# Patient Record
Sex: Female | Born: 1970 | Race: White | Hispanic: No | Marital: Single | Smoking: Never smoker
Health system: Southern US, Community
[De-identification: ages and names within clinical notes are randomized; demographics above are authoritative.]

## PROBLEM LIST (undated history)

## (undated) DIAGNOSIS — E119 Type 2 diabetes mellitus without complications: Secondary | ICD-10-CM

## (undated) DIAGNOSIS — Q059 Spina bifida, unspecified: Secondary | ICD-10-CM

## (undated) DIAGNOSIS — G8220 Paraplegia, unspecified: Secondary | ICD-10-CM

---

## 2010-03-28 ENCOUNTER — Emergency Department (HOSPITAL_BASED_OUTPATIENT_CLINIC_OR_DEPARTMENT_OTHER): Admission: EM | Admit: 2010-03-28 | Discharge: 2010-03-28 | Payer: Self-pay | Admitting: Emergency Medicine

## 2013-06-14 ENCOUNTER — Ambulatory Visit: Payer: Self-pay | Admitting: Cardiology

## 2014-12-21 DIAGNOSIS — Z9359 Other cystostomy status: Secondary | ICD-10-CM

## 2015-10-13 DIAGNOSIS — I10 Essential (primary) hypertension: Secondary | ICD-10-CM | POA: Diagnosis present

## 2015-10-13 DIAGNOSIS — E1165 Type 2 diabetes mellitus with hyperglycemia: Secondary | ICD-10-CM | POA: Diagnosis present

## 2016-12-16 ENCOUNTER — Other Ambulatory Visit: Payer: Self-pay | Admitting: Physician Assistant

## 2016-12-16 DIAGNOSIS — R928 Other abnormal and inconclusive findings on diagnostic imaging of breast: Secondary | ICD-10-CM

## 2016-12-29 ENCOUNTER — Other Ambulatory Visit: Payer: Self-pay | Admitting: Physician Assistant

## 2016-12-29 DIAGNOSIS — R928 Other abnormal and inconclusive findings on diagnostic imaging of breast: Secondary | ICD-10-CM

## 2016-12-29 DIAGNOSIS — N6489 Other specified disorders of breast: Secondary | ICD-10-CM

## 2017-01-03 ENCOUNTER — Other Ambulatory Visit: Payer: Self-pay | Admitting: Physician Assistant

## 2017-01-03 ENCOUNTER — Ambulatory Visit
Admission: RE | Admit: 2017-01-03 | Discharge: 2017-01-03 | Disposition: A | Discharge status: Home / Self Care | Payer: Medicare Other | Source: Ambulatory Visit | Attending: Physician Assistant | Admitting: Physician Assistant

## 2017-01-03 DIAGNOSIS — R599 Enlarged lymph nodes, unspecified: Secondary | ICD-10-CM

## 2017-01-03 DIAGNOSIS — N6489 Other specified disorders of breast: Secondary | ICD-10-CM

## 2017-01-03 DIAGNOSIS — R928 Other abnormal and inconclusive findings on diagnostic imaging of breast: Secondary | ICD-10-CM

## 2018-07-03 IMAGING — MG STEREOTACTIC VACUUM ASSIST RIGHT
3 series · 3 of 11 positions shown · non-contrast
Comparison: Previous exams.

ADDENDUM:
Pathology revealed DUCTAL PAPILLOMA WITH USUAL DUCTAL HYPERPLASIA,
ADENOSIS WITH CALCIFICATIONS of the Right breast, central inferior.
This was found to be discordant by Dr. Dubiso Terafa, with excision
recommended. Pathology revealed BENIGN LYMPHOID TISSUE of the Right
axilla. This was found to be concordant by Dr. Dubiso Terafa.
Pathology results were discussed with the patient by telephone. The
patient reported doing well after the biopsies with tenderness at
the sites. Post biopsy instructions and care were reviewed and
questions were answered. The patient was encouraged to call The
Surgical consultation has been arranged with Dr. Uly Siciliano at
[[email or web address]] in [HOSPITAL][HOSPITAL], per patient request on
January 07, 2017. Imaging and pathology reports were faxed to Dr.
Lerie on January 04, 2017.

Pathology results reported by Swanson, RN on 01/04/2017.
CLINICAL DATA: Right breast distortion
EXAM:
RIGHT BREAST STEREOTACTIC CORE NEEDLE BIOPSY

[R CC]
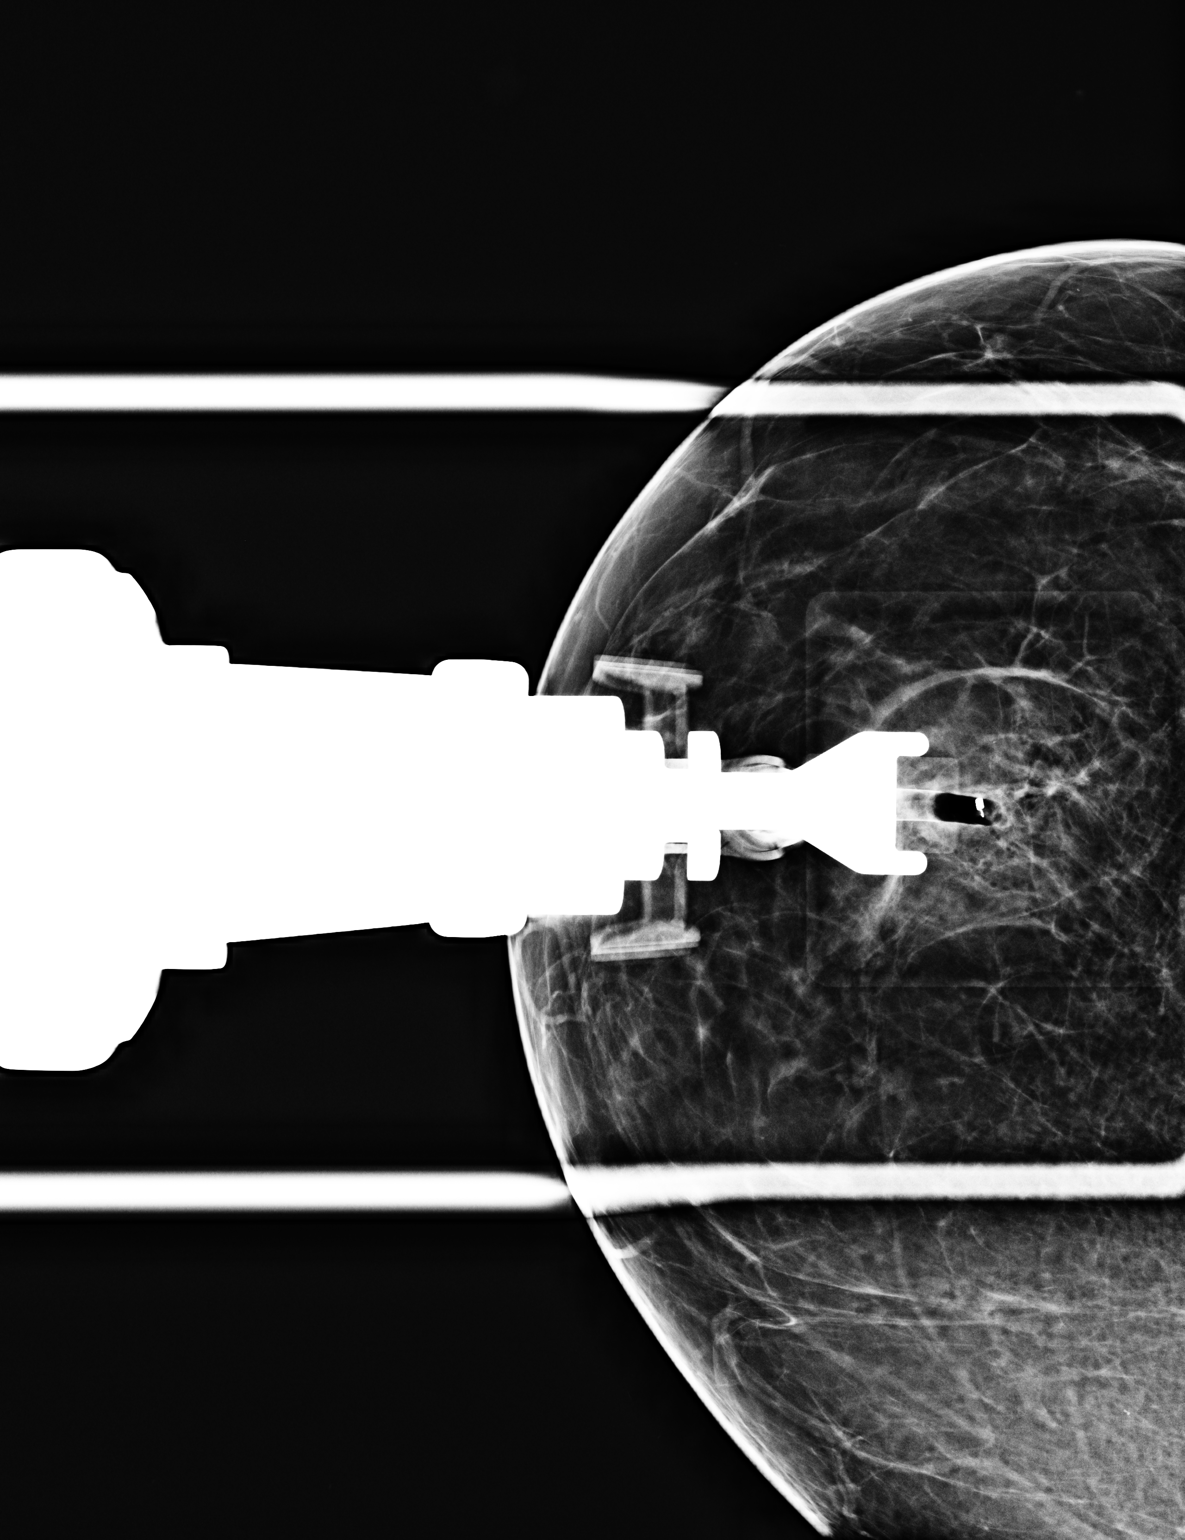

[R CC tomo (1 of 2) · tomo slice 23/46.0]
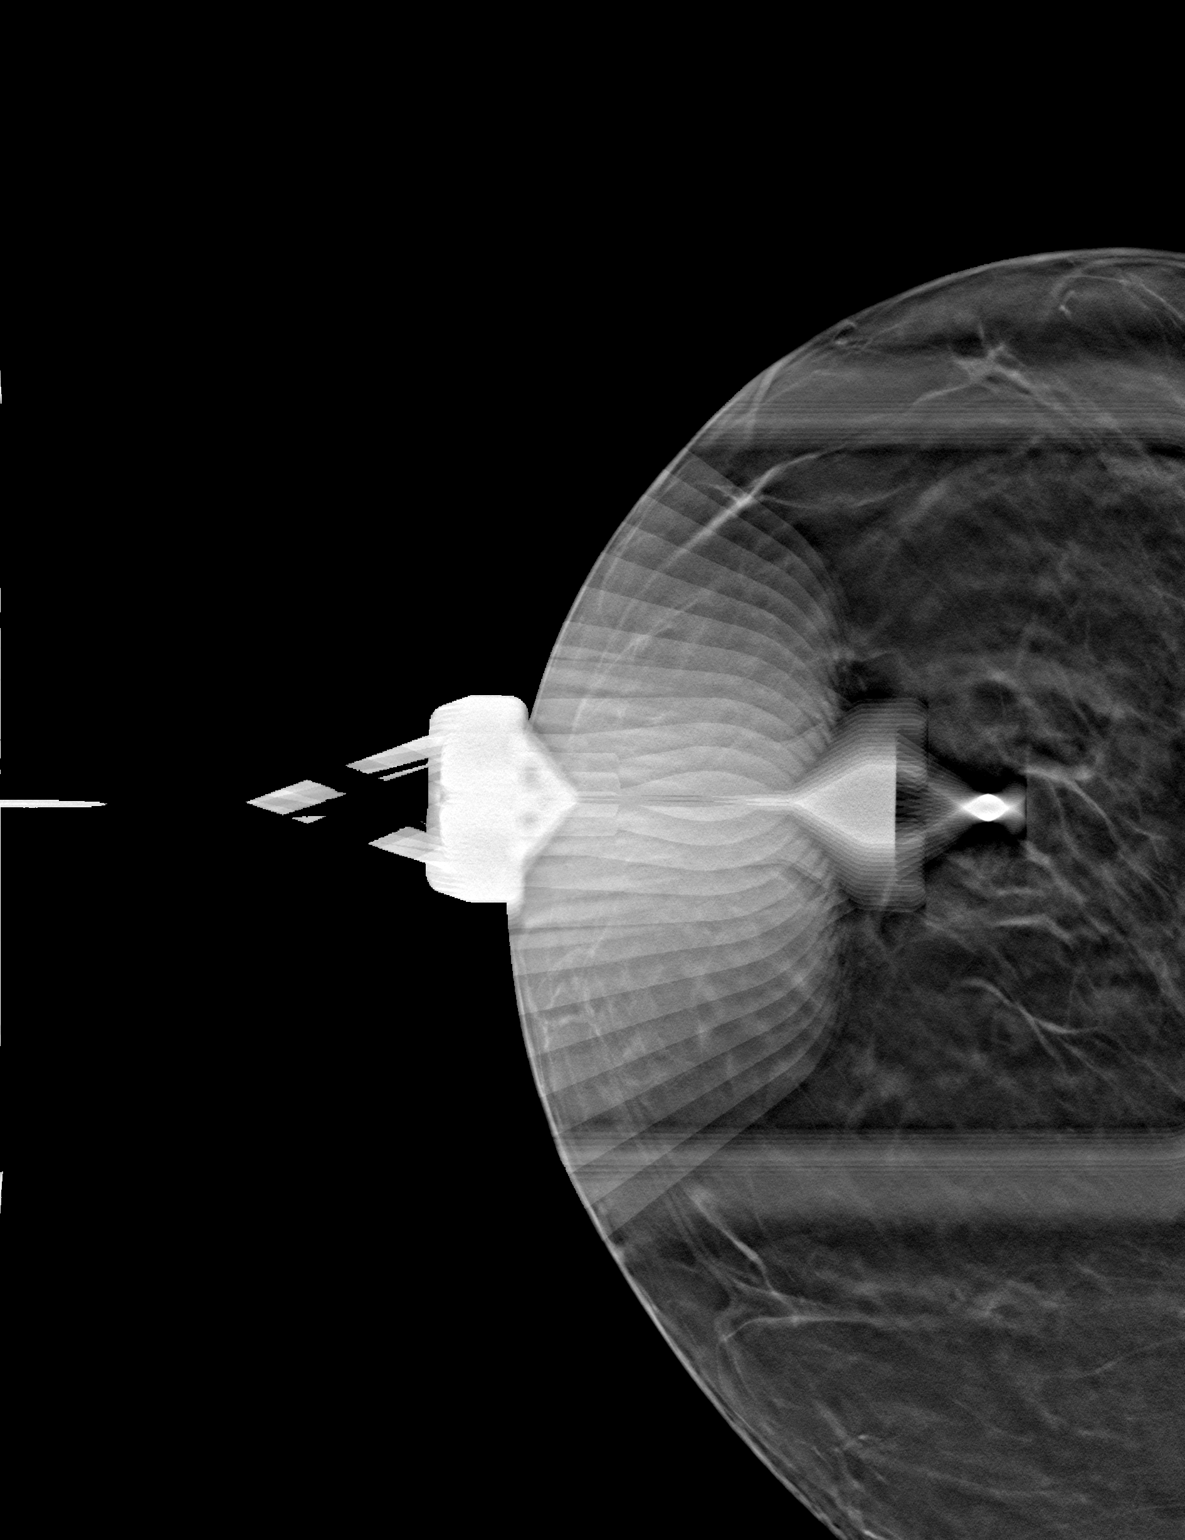

[R CC tomo (2 of 2) · tomo slice 23/46.0]
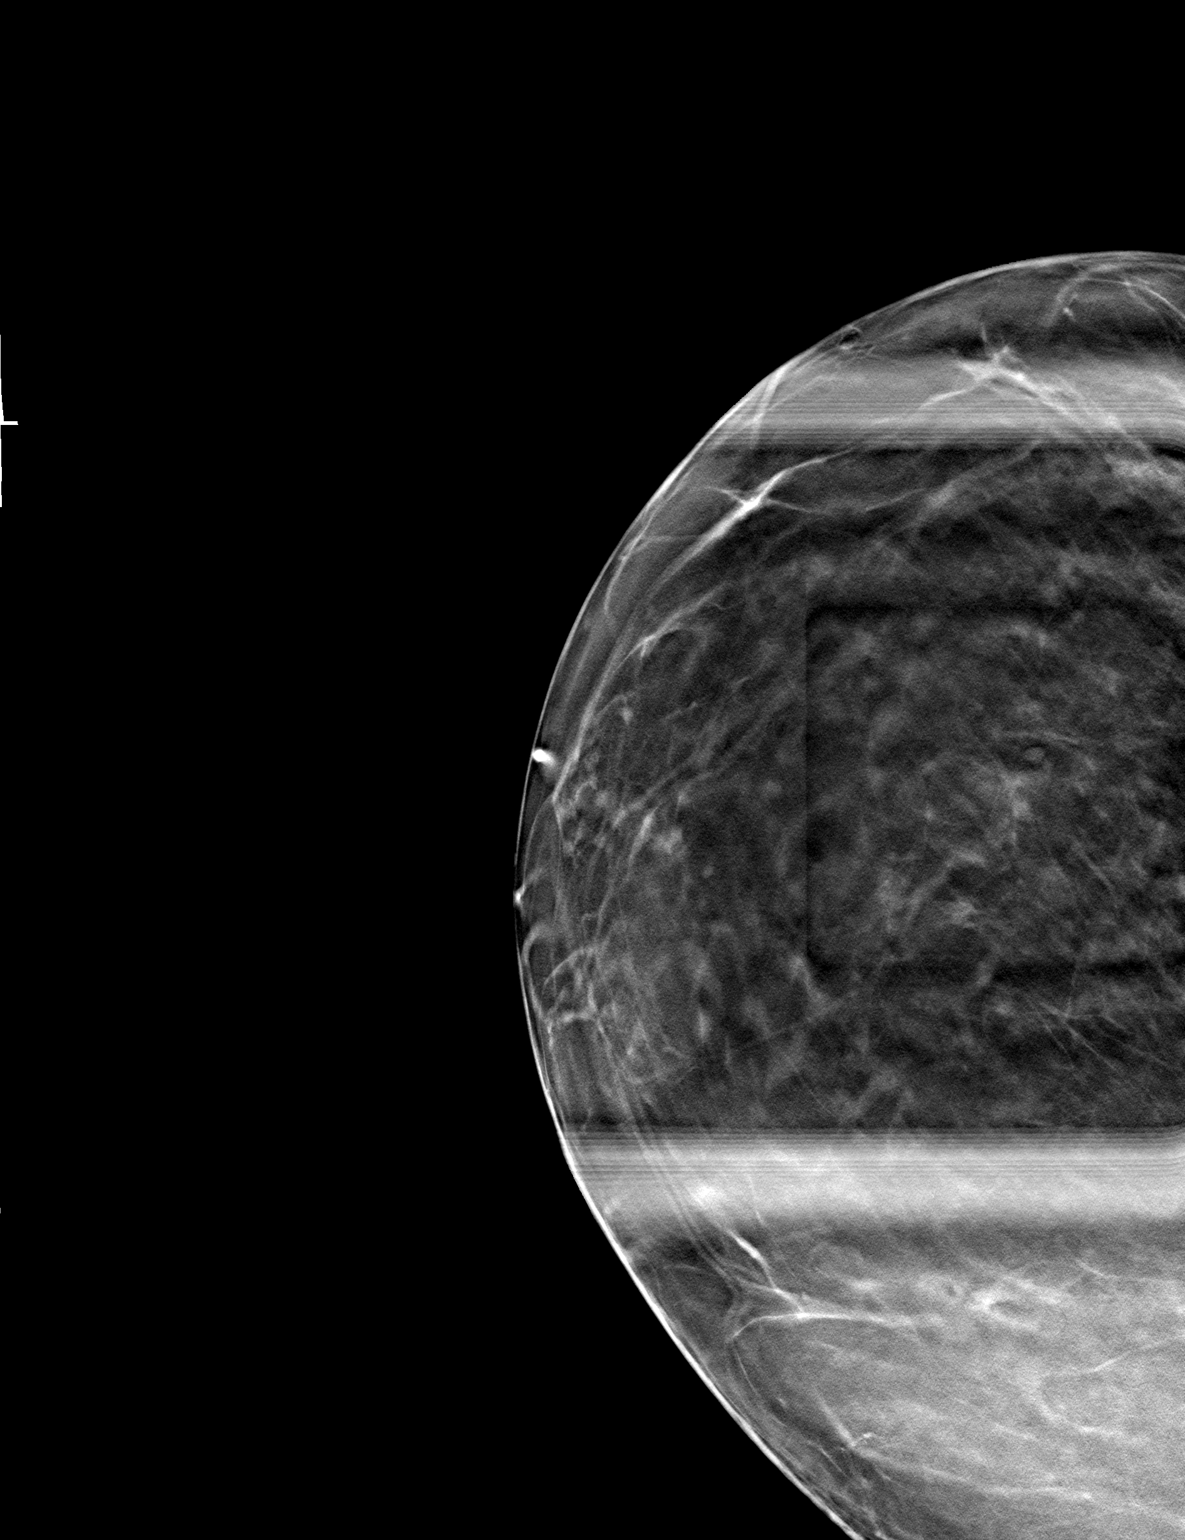

[3 of 11 positions shown; findings below may reference images not displayed]



Using sterile technique and 1% Lidocaine as local anesthetic, under
stereotactic guidance, a 9 gauge vacuum assisted device was used to
perform core needle biopsy of distortion in the inferior central
right breast, only seen on the CC view using a superior approach.

Lesion quadrant: Lower inner

At the conclusion of the procedure, a tissue marker clip was
deployed into the biopsy cavity. Follow-up 2-view mammogram was
performed and dictated separately.
IMPRESSION: Stereotactic-guided biopsy of right breast distortion. No apparent
complications.

## 2018-07-03 IMAGING — MG MM CLIP PLACEMENT
3 series · 3 of 7 positions shown · non-contrast
Comparison: Previous exam(s).

CLINICAL DATA: Evaluate marker placement

EXAM:
DIAGNOSTIC RIGHT MAMMOGRAM POST STEREOTACTIC BIOPSY

[R CC synth-2D]
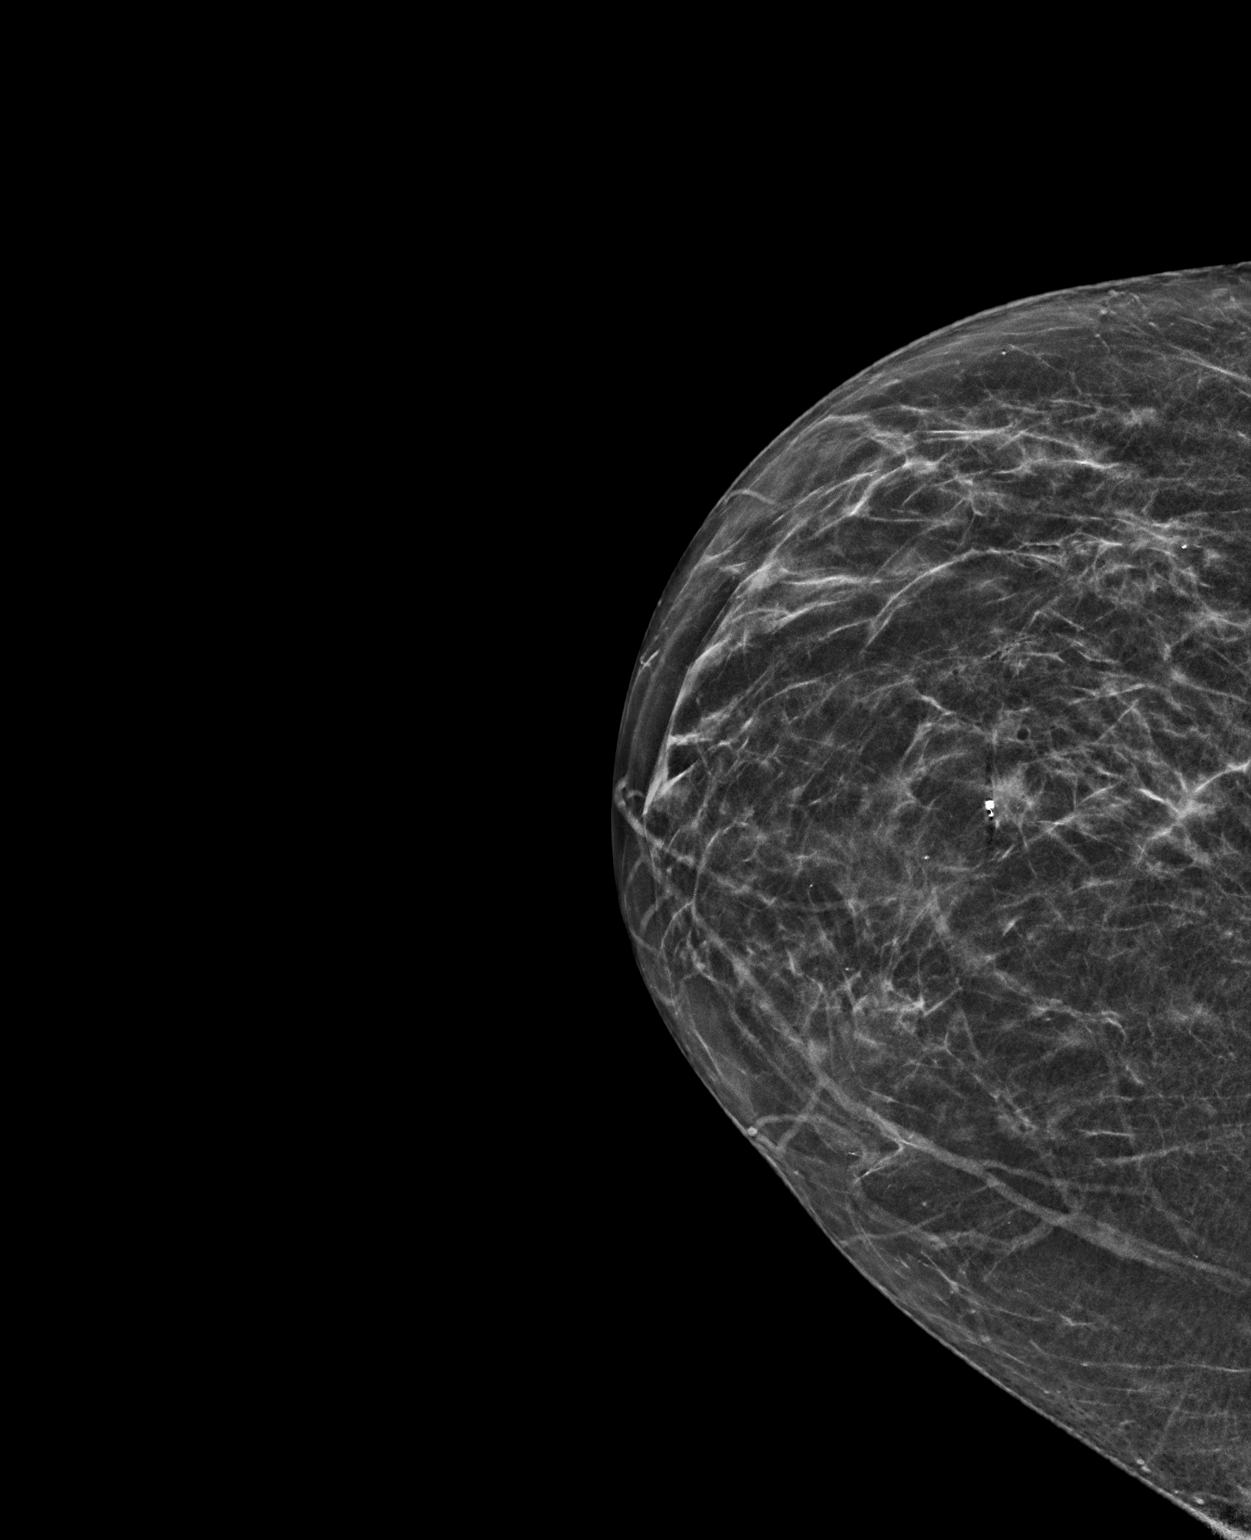

[R CC]
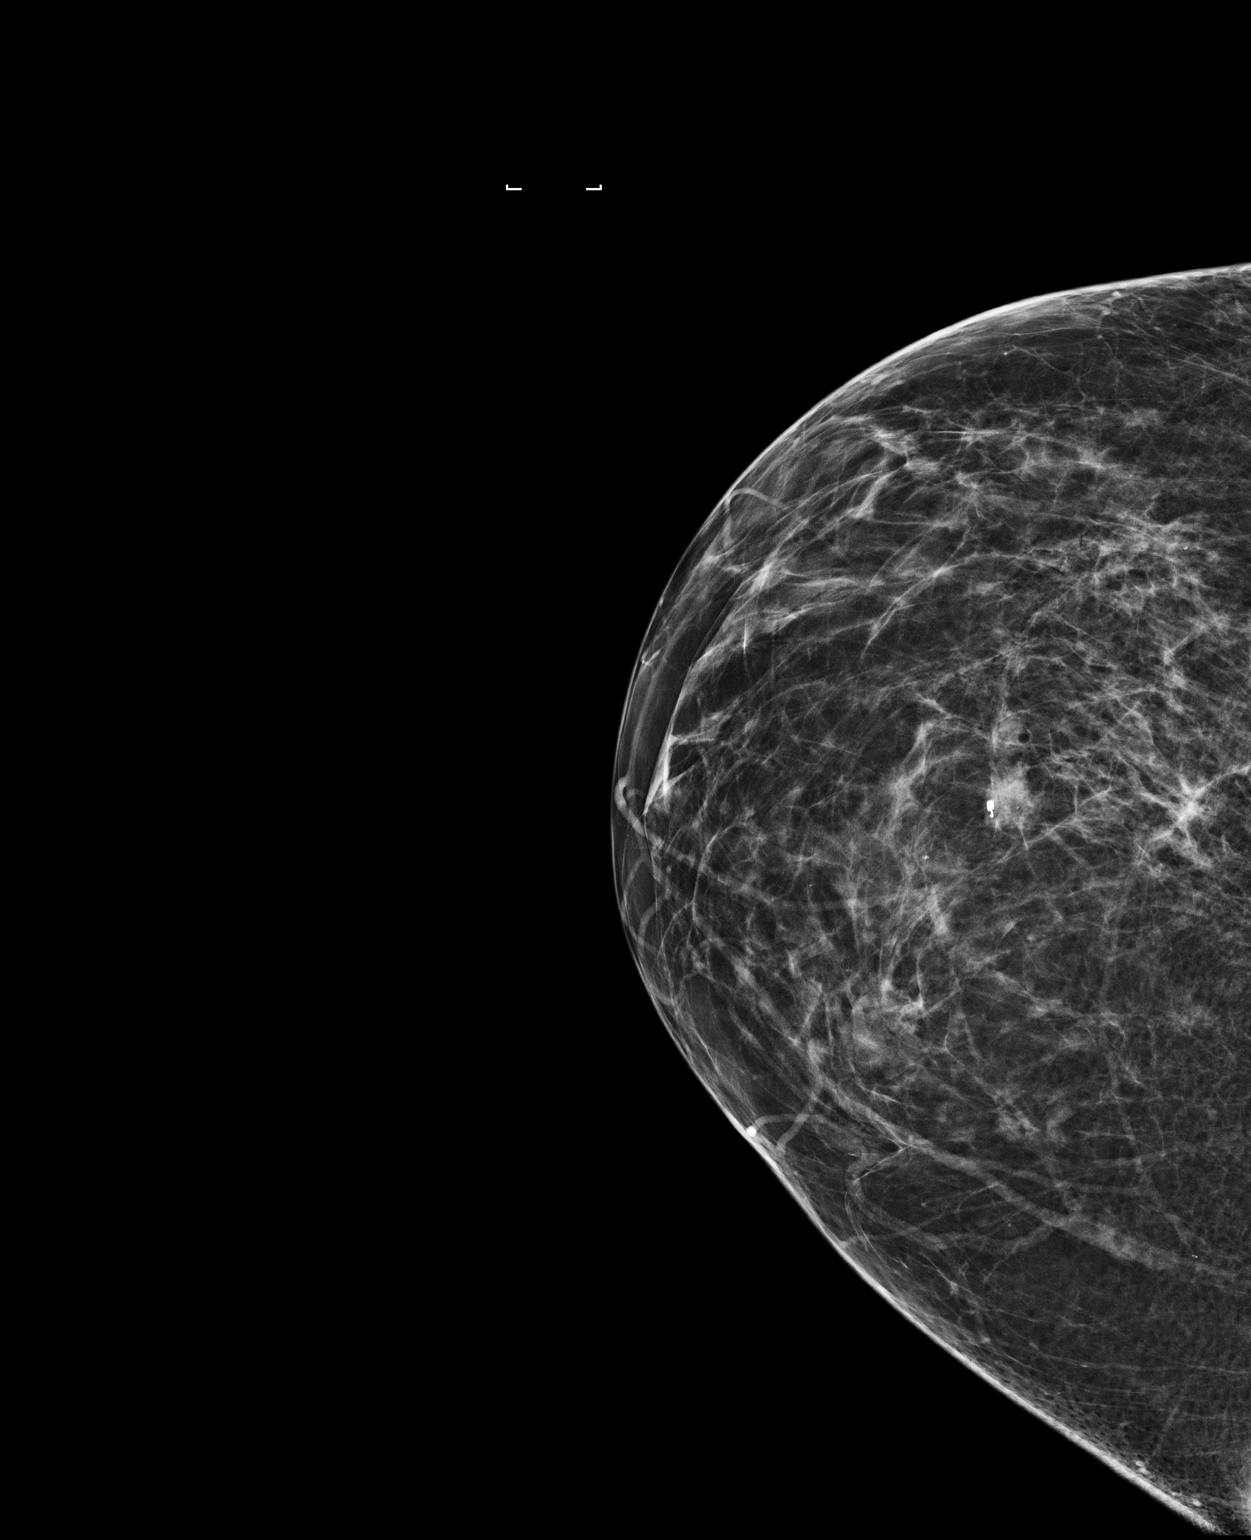

[R CC tomo · tomo slice 27/53.0]
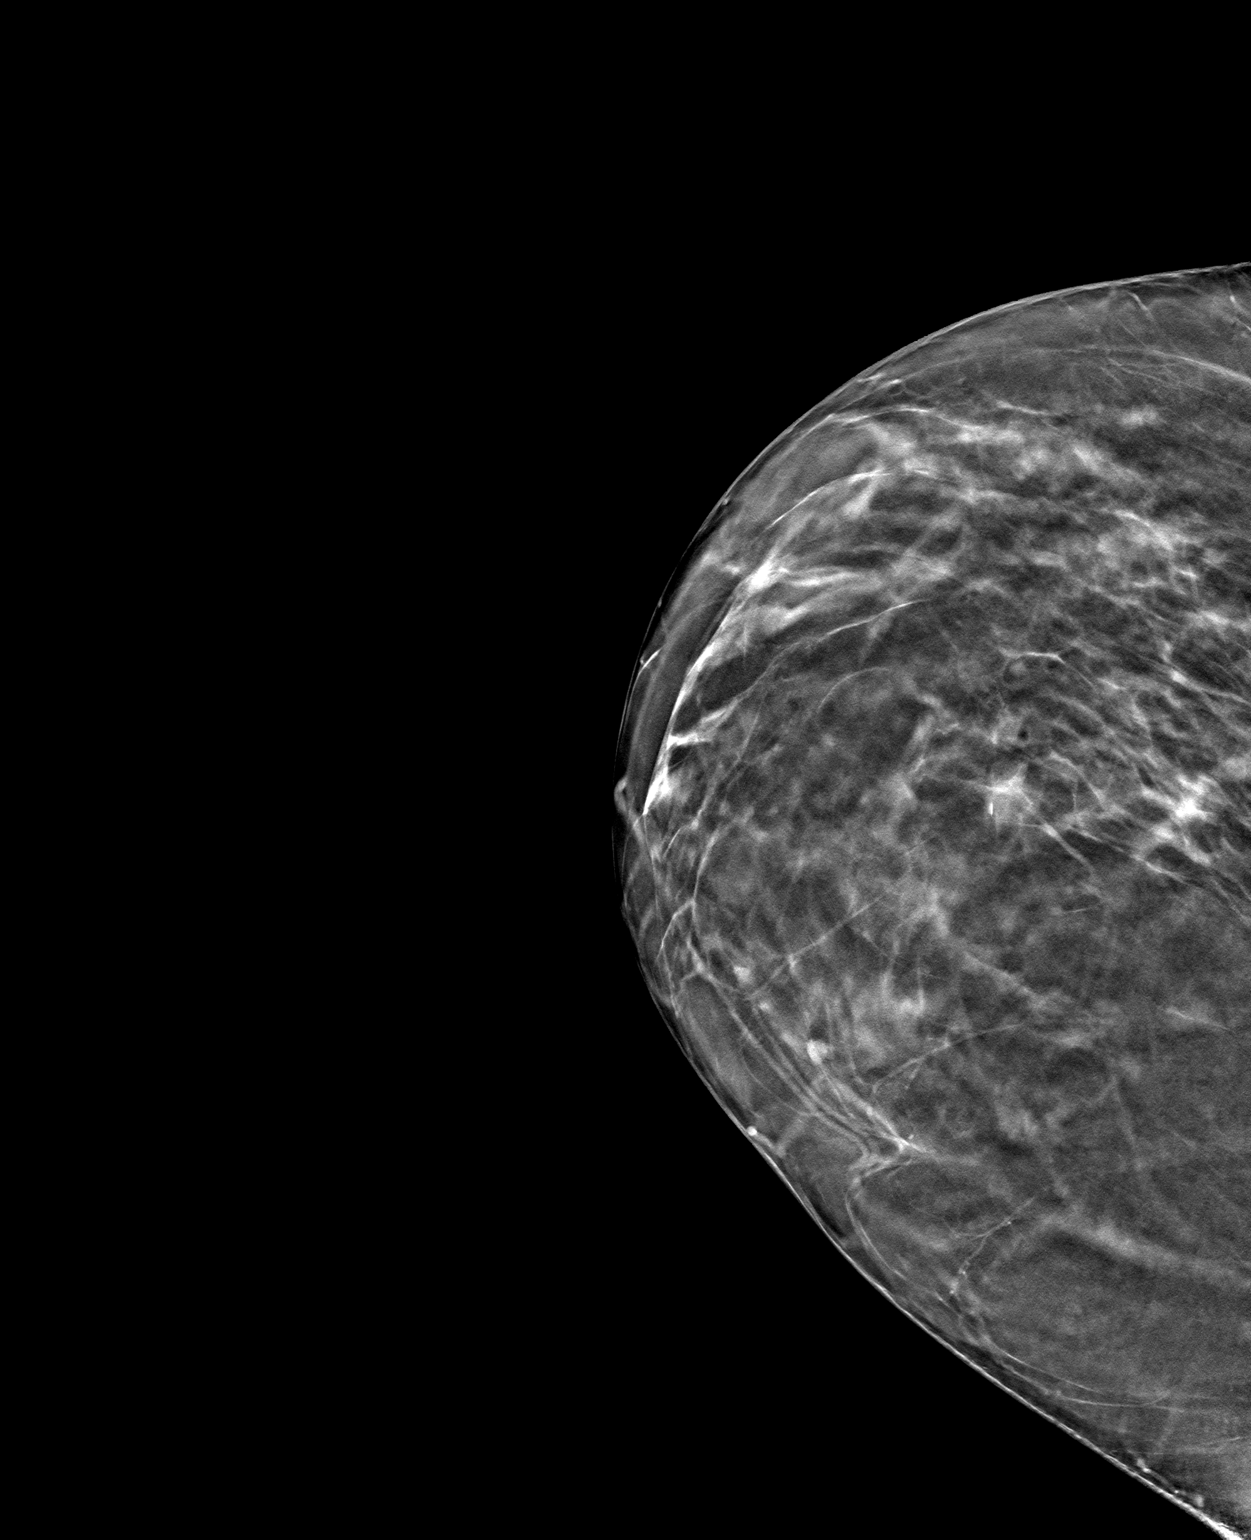

[3 of 7 positions shown; findings below may reference images not displayed]

FINDINGS: Mammographic images were obtained following stereotactic guided
biopsy of right breast distortion. The clip appears to be along the
anterior margin of the distortion on 3D images.
IMPRESSION: Appropriate clip placement based on 3D images.

Final Assessment: Post Procedure Mammograms for Marker Placement

## 2022-02-12 ENCOUNTER — Emergency Department (HOSPITAL_BASED_OUTPATIENT_CLINIC_OR_DEPARTMENT_OTHER): Payer: Medicare (Managed Care)

## 2022-02-12 ENCOUNTER — Inpatient Hospital Stay (HOSPITAL_BASED_OUTPATIENT_CLINIC_OR_DEPARTMENT_OTHER)
Admission: EM | Admit: 2022-02-12 | Discharge: 2022-02-17 | DRG: 602 | Disposition: A | Discharge status: Home / Self Care | Payer: Medicare (Managed Care) | Attending: Family Medicine | Admitting: Family Medicine

## 2022-02-12 ENCOUNTER — Encounter (HOSPITAL_BASED_OUTPATIENT_CLINIC_OR_DEPARTMENT_OTHER): Payer: Self-pay

## 2022-02-12 ENCOUNTER — Other Ambulatory Visit: Payer: Self-pay

## 2022-02-12 DIAGNOSIS — I10 Essential (primary) hypertension: Secondary | ICD-10-CM | POA: Diagnosis present

## 2022-02-12 DIAGNOSIS — R7881 Bacteremia: Secondary | ICD-10-CM | POA: Diagnosis present

## 2022-02-12 DIAGNOSIS — L89324 Pressure ulcer of left buttock, stage 4: Secondary | ICD-10-CM | POA: Diagnosis present

## 2022-02-12 DIAGNOSIS — E876 Hypokalemia: Secondary | ICD-10-CM | POA: Diagnosis not present

## 2022-02-12 DIAGNOSIS — Z79899 Other long term (current) drug therapy: Secondary | ICD-10-CM

## 2022-02-12 DIAGNOSIS — Q052 Lumbar spina bifida with hydrocephalus: Secondary | ICD-10-CM

## 2022-02-12 DIAGNOSIS — L89892 Pressure ulcer of other site, stage 2: Secondary | ICD-10-CM | POA: Diagnosis present

## 2022-02-12 DIAGNOSIS — A4102 Sepsis due to Methicillin resistant Staphylococcus aureus: Principal | ICD-10-CM | POA: Diagnosis present

## 2022-02-12 DIAGNOSIS — D649 Anemia, unspecified: Secondary | ICD-10-CM | POA: Diagnosis present

## 2022-02-12 DIAGNOSIS — L89154 Pressure ulcer of sacral region, stage 4: Secondary | ICD-10-CM | POA: Diagnosis present

## 2022-02-12 DIAGNOSIS — A4902 Methicillin resistant Staphylococcus aureus infection, unspecified site: Secondary | ICD-10-CM | POA: Diagnosis present

## 2022-02-12 DIAGNOSIS — A419 Sepsis, unspecified organism: Secondary | ICD-10-CM

## 2022-02-12 DIAGNOSIS — Z888 Allergy status to other drugs, medicaments and biological substances status: Secondary | ICD-10-CM

## 2022-02-12 DIAGNOSIS — L03116 Cellulitis of left lower limb: Principal | ICD-10-CM

## 2022-02-12 DIAGNOSIS — N319 Neuromuscular dysfunction of bladder, unspecified: Secondary | ICD-10-CM | POA: Diagnosis present

## 2022-02-12 DIAGNOSIS — Z933 Colostomy status: Secondary | ICD-10-CM

## 2022-02-12 DIAGNOSIS — R310 Gross hematuria: Secondary | ICD-10-CM | POA: Diagnosis present

## 2022-02-12 DIAGNOSIS — Z7984 Long term (current) use of oral hypoglycemic drugs: Secondary | ICD-10-CM

## 2022-02-12 DIAGNOSIS — B9562 Methicillin resistant Staphylococcus aureus infection as the cause of diseases classified elsewhere: Secondary | ICD-10-CM

## 2022-02-12 DIAGNOSIS — L539 Erythematous condition, unspecified: Secondary | ICD-10-CM | POA: Diagnosis present

## 2022-02-12 DIAGNOSIS — G8220 Paraplegia, unspecified: Secondary | ICD-10-CM | POA: Diagnosis present

## 2022-02-12 DIAGNOSIS — L89159 Pressure ulcer of sacral region, unspecified stage: Secondary | ICD-10-CM

## 2022-02-12 DIAGNOSIS — Q6589 Other specified congenital deformities of hip: Secondary | ICD-10-CM

## 2022-02-12 DIAGNOSIS — Z91048 Other nonmedicinal substance allergy status: Secondary | ICD-10-CM

## 2022-02-12 DIAGNOSIS — N390 Urinary tract infection, site not specified: Secondary | ICD-10-CM | POA: Diagnosis present

## 2022-02-12 DIAGNOSIS — Z91040 Latex allergy status: Secondary | ICD-10-CM

## 2022-02-12 DIAGNOSIS — L89314 Pressure ulcer of right buttock, stage 4: Secondary | ICD-10-CM | POA: Diagnosis present

## 2022-02-12 DIAGNOSIS — Z7982 Long term (current) use of aspirin: Secondary | ICD-10-CM

## 2022-02-12 DIAGNOSIS — M7989 Other specified soft tissue disorders: Secondary | ICD-10-CM | POA: Diagnosis present

## 2022-02-12 DIAGNOSIS — E1165 Type 2 diabetes mellitus with hyperglycemia: Secondary | ICD-10-CM | POA: Diagnosis present

## 2022-02-12 DIAGNOSIS — Z9359 Other cystostomy status: Secondary | ICD-10-CM

## 2022-02-12 DIAGNOSIS — Z6841 Body Mass Index (BMI) 40.0 and over, adult: Secondary | ICD-10-CM

## 2022-02-12 DIAGNOSIS — Q054 Unspecified spina bifida with hydrocephalus: Secondary | ICD-10-CM

## 2022-02-12 DIAGNOSIS — E1169 Type 2 diabetes mellitus with other specified complication: Secondary | ICD-10-CM | POA: Diagnosis present

## 2022-02-12 DIAGNOSIS — Z20822 Contact with and (suspected) exposure to covid-19: Secondary | ICD-10-CM | POA: Diagnosis present

## 2022-02-12 DIAGNOSIS — Z881 Allergy status to other antibiotic agents status: Secondary | ICD-10-CM

## 2022-02-12 HISTORY — DX: Spina bifida, unspecified: Q05.9

## 2022-02-12 HISTORY — DX: Type 2 diabetes mellitus without complications: E11.9

## 2022-02-12 HISTORY — DX: Paraplegia, unspecified: G82.20

## 2022-02-12 LAB — COMPREHENSIVE METABOLIC PANEL
ALT: 20 U/L (ref 0–44)
AST: 19 U/L (ref 15–41)
Albumin: 3.1 g/dL — ABNORMAL LOW (ref 3.5–5.0)
Alkaline Phosphatase: 129 U/L — ABNORMAL HIGH (ref 38–126)
Anion gap: 11 (ref 5–15)
BUN: 14 mg/dL (ref 6–20)
CO2: 22 mmol/L (ref 22–32)
Calcium: 9.2 mg/dL (ref 8.9–10.3)
Chloride: 101 mmol/L (ref 98–111)
Creatinine, Ser: 0.43 mg/dL — ABNORMAL LOW (ref 0.44–1.00)
GFR, Estimated: >60 mL/min (ref >60)
Glucose, Bld: 189 mg/dL — ABNORMAL HIGH (ref 70–99)
Potassium: 3.8 mmol/L (ref 3.5–5.1)
Sodium: 134 mmol/L — ABNORMAL LOW (ref 135–145)
Total Bilirubin: 0.4 mg/dL (ref 0.3–1.2)
Total Protein: 8.2 g/dL — ABNORMAL HIGH (ref 6.5–8.1)

## 2022-02-12 LAB — PROTIME-INR
INR: 1.2 (ref 0.8–1.2)
Prothrombin Time: 15.4 seconds — ABNORMAL HIGH (ref 11.4–15.2)

## 2022-02-12 LAB — CBC WITH DIFFERENTIAL/PLATELET
Abs Immature Granulocytes: 0.1 10*3/uL — ABNORMAL HIGH (ref 0.00–0.07)
Basophils Absolute: 0.1 10*3/uL (ref 0.0–0.1)
Basophils Relative: 0 %
Eosinophils Absolute: 0.2 10*3/uL (ref 0.0–0.5)
Eosinophils Relative: 2 %
HCT: 33.2 % — ABNORMAL LOW (ref 36.0–46.0)
Hemoglobin: 10.6 g/dL — ABNORMAL LOW (ref 12.0–15.0)
Immature Granulocytes: 1 %
Lymphocytes Relative: 9 %
Lymphs Abs: 1.4 10*3/uL (ref 0.7–4.0)
MCH: 25.1 pg — ABNORMAL LOW (ref 26.0–34.0)
MCHC: 31.9 g/dL (ref 30.0–36.0)
MCV: 78.7 fL — ABNORMAL LOW (ref 80.0–100.0)
Monocytes Absolute: 1 10*3/uL (ref 0.1–1.0)
Monocytes Relative: 6 %
Neutro Abs: 12.4 10*3/uL — ABNORMAL HIGH (ref 1.7–7.7)
Neutrophils Relative %: 82 %
Platelets: 433 10*3/uL — ABNORMAL HIGH (ref 150–400)
RBC: 4.22 MIL/uL (ref 3.87–5.11)
RDW: 18.6 % — ABNORMAL HIGH (ref 11.5–15.5)
WBC: 15.1 10*3/uL — ABNORMAL HIGH (ref 4.0–10.5)
nRBC: 0 % (ref 0.0–0.2)

## 2022-02-12 LAB — URINALYSIS, MICROSCOPIC (REFLEX)

## 2022-02-12 LAB — URINALYSIS, ROUTINE W REFLEX MICROSCOPIC
Bilirubin Urine: NEGATIVE
Glucose, UA: NEGATIVE mg/dL
Ketones, ur: NEGATIVE mg/dL
Nitrite: POSITIVE — AB
Protein, ur: 100 mg/dL — AB
Specific Gravity, Urine: <=1.005 (ref 1.005–1.030)
pH: 6 (ref 5.0–8.0)

## 2022-02-12 LAB — RESP PANEL BY RT-PCR (FLU A&B, COVID) ARPGX2
Influenza A by PCR: NEGATIVE
Influenza B by PCR: NEGATIVE
SARS Coronavirus 2 by RT PCR: NEGATIVE

## 2022-02-12 LAB — APTT: aPTT: 38 seconds — ABNORMAL HIGH (ref 24–36)

## 2022-02-12 LAB — PREGNANCY, URINE: Preg Test, Ur: NEGATIVE

## 2022-02-12 LAB — LACTIC ACID, PLASMA: Lactic Acid, Venous: 1.5 mmol/L (ref 0.5–1.9)

## 2022-02-12 MED ORDER — LACTATED RINGERS IV SOLN: INTRAVENOUS | Status: DC

## 2022-02-12 MED ORDER — SODIUM CHLORIDE 0.9 % IV BOLUS
1000.0000 mL | Freq: Once | INTRAVENOUS | Status: AC
  Administered 2022-02-12: 1000 mL via INTRAVENOUS

## 2022-02-12 MED ORDER — SODIUM CHLORIDE 0.9 % IV SOLN
2.0000 g | INTRAVENOUS | Status: DC
  Administered 2022-02-12: 2 g via INTRAVENOUS
  Filled 2022-02-12: qty 20

## 2022-02-12 MED ORDER — ACETAMINOPHEN 325 MG PO TABS
650.0000 mg | ORAL_TABLET | Freq: Four times a day (QID) | ORAL | Status: DC | PRN
  Administered 2022-02-12 – 2022-02-17 (×3): 650 mg via ORAL
  Filled 2022-02-12 (×3): qty 2

## 2022-02-12 MED ORDER — VANCOMYCIN HCL IN DEXTROSE 1-5 GM/200ML-% IV SOLN
1000.0000 mg | Freq: Once | INTRAVENOUS | Status: AC
  Administered 2022-02-12: 1000 mg via INTRAVENOUS
  Filled 2022-02-12: qty 200

## 2022-02-12 NOTE — ED Notes (Signed)
US at bedside

## 2022-02-12 NOTE — ED Provider Notes (Signed)
[facility] EMERGENCY DEPARTMENT Provider Note   CSN: [id number] Arrival date & time: 02/12/22  1650     History  Chief Complaint  Patient presents with   Hematuria        Leg Swelling    April Cabrera is a 51 y.o. female.  The history is provided by the patient, a relative and medical records. No language interpreter was used.  Hematuria     April Cabrera is a 51 yo Female with history of HTN, T2DM, Spina Bifida, neurogenic bladder with foley catheter and paraplegia presents to the ED today with Hematuria x 1day, Leg swelling and redness x 1 day, and tachycardia. Patient unable to feel anything below the waist, but can tell there is more pressure than usual. She denies any abdominal pain, fever, chills, recent weight loss. She is on aspirin. Family at bedside reports left leg swelling and redness since yesterday. Patient unable to feel pain in that leg but reports that her skin is constantly dry and flaky in that area. Her family reports that at baseline, she has a heart rate in the 100s, but not typically as high as seen in the office today at 120s. She denies chest pain, increased pressure, shortness of breath, difficulty breathing, back pain, or upper abdominal pain.  Home Medications Prior to Admission medications   Not on File      Allergies    Ciprofloxacin, Macrobid [nitrofurantoin], Wound dressing adhesive, and Latex    Review of Systems   Review of Systems  Genitourinary:  Positive for hematuria.  All other systems reviewed and are negative.   Physical Exam Updated Vital Signs BP 137/88 (BP Location: Left Arm)   Pulse (!) 124   Temp 100 F (37.8 C) (Oral)   Resp 20   SpO2 96%  Physical Exam Vitals and nursing note reviewed.  Constitutional:      General: She is not in acute distress.    Appearance: She is well-developed.     Comments: Patient resting in bed appears to be in no acute discomfort  HENT:     Head: Atraumatic.  Eyes:      Conjunctiva/sclera: Conjunctivae normal.  Cardiovascular:     Rate and Rhythm: Tachycardia present.  Pulmonary:     Effort: Pulmonary effort is normal.  Abdominal:     Palpations: Abdomen is soft.     Tenderness: There is abdominal tenderness (Mild suprapubic tenderness.  Foley catheter in place.  Ostomy bag noted to left abdomen).  Genitourinary:    Comments: Large sacral ulcer Musculoskeletal:        General: Swelling (Left lower extremity: Erythema edema and warmth noted throughout the left lower extremity from the foot extending towards the knee mildly tender to palpation. pedis pulse palpable.) present.     Cervical back: Neck supple.  Skin:    Findings: No rash.  Neurological:     Mental Status: She is alert. Mental status is at baseline.  Psychiatric:        Mood and Affect: Mood normal.     ED Results / Procedures / Treatments   Labs (all labs ordered are listed, but only abnormal results are displayed) Labs Reviewed  CBC WITH DIFFERENTIAL/PLATELET - Abnormal; Notable for the following components:      Result Value   WBC 15.1 (*)    Hemoglobin 10.6 (*)    HCT 33.2 (*)    MCV 78.7 (*)    MCH 25.1 (*)    RDW 18.6 (*)  Platelets 433 (*)    Neutro Abs 12.4 (*)    Abs Immature Granulocytes 0.10 (*)    All other components within normal limits  COMPREHENSIVE METABOLIC PANEL - Abnormal; Notable for the following components:   Sodium 134 (*)    Glucose, Bld 189 (*)    Creatinine, Ser 0.43 (*)    Total Protein 8.2 (*)    Albumin 3.1 (*)    Alkaline Phosphatase 129 (*)    All other components within normal limits  URINALYSIS, ROUTINE W REFLEX MICROSCOPIC - Abnormal; Notable for the following components:   APPearance CLOUDY (*)    Hgb urine dipstick LARGE (*)    Protein, ur 100 (*)    Nitrite POSITIVE (*)    Leukocytes,Ua LARGE (*)    All other components within normal limits  PROTIME-INR - Abnormal; Notable for the following components:   Prothrombin Time 15.4  (*)    All other components within normal limits  APTT - Abnormal; Notable for the following components:   aPTT 38 (*)    All other components within normal limits  URINALYSIS, MICROSCOPIC (REFLEX) - Abnormal; Notable for the following components:   Bacteria, UA MANY (*)    All other components within normal limits  RESP PANEL BY RT-PCR (FLU A&B, COVID) ARPGX2  CULTURE, BLOOD (ROUTINE X 2)  CULTURE, BLOOD (ROUTINE X 2)  URINE CULTURE  PREGNANCY, URINE  LACTIC ACID, PLASMA  LACTIC ACID, PLASMA    EKG EKG Interpretation  Date/Time:  Friday February 12 2022 17:59:34 EDT Ventricular Rate:  122 PR Interval:  131 QRS Duration: 90 QT Interval:  355 QTC Calculation: 506 R Axis:   87 Text Interpretation: Sinus tachycardia Confirmed by Lennice Sites ([id number]) on 02/12/2022 8:08:32 PM  Radiology US Venous Img Lower  Left (DVT Study)  Result Date: 02/12/2022 CLINICAL DATA:  Left leg edema. EXAM: LEFT LOWER EXTREMITY VENOUS DOPPLER ULTRASOUND TECHNIQUE: Gray-scale sonography with compression, as well as color and duplex ultrasound, were performed to evaluate the deep venous system(s) from the level of the common femoral vein through the popliteal and proximal calf veins. COMPARISON:  None Available. FINDINGS: VENOUS Normal compressibility of the common femoral, superficial femoral, and popliteal veins, as well as the visualized calf veins. Visualized portions of profunda femoral vein and great saphenous vein unremarkable. No filling defects to suggest DVT on grayscale or color Doppler imaging. Doppler waveforms show normal direction of venous flow, normal respiratory plasticity and response to augmentation. Limited views of the contralateral common femoral vein are unremarkable. OTHER Subcutaneous soft tissue edema is noted. Limitations: Diminished patient mobility. IMPRESSION: 1. No evidence of left lower extremity DVT. 2. Left leg soft tissue edema. Electronically Signed   By: Keith Rake M.D.    On: 02/12/2022 19:46   DG Chest Port 1 View  Result Date: 02/12/2022 CLINICAL DATA:  Hematuria, low-grade fever, sepsis EXAM: PORTABLE CHEST 1 VIEW COMPARISON:  07/06/2017 FINDINGS: Single frontal view of the chest demonstrates an unremarkable cardiac silhouette. Shunt catheters are unchanged. Chronic elevation the right hemidiaphragm. No acute airspace disease, effusion, or pneumothorax. No acute bony abnormality. IMPRESSION: 1. Stable chest, no acute process. Electronically Signed   By: Randa Ngo M.D.   On: 02/12/2022 17:57    Procedures .Critical Care  Performed by: Domenic Moras, PA-C Authorized by: Domenic Moras, PA-C   Critical care provider statement:    Critical care time (minutes):  30   Critical care was time spent personally by me on the following activities:  Development of treatment plan with patient or surrogate, discussions with consultants, evaluation of patient's response to treatment, examination of patient, ordering and review of laboratory studies, ordering and review of radiographic studies, ordering and performing treatments and interventions, pulse oximetry, re-evaluation of patient's condition and review of old charts     Medications Ordered in ED Medications  lactated ringers infusion ( Intravenous New Bag/Given 02/12/22 1756)  cefTRIAXone (ROCEPHIN) 2 g in sodium chloride 0.9 % 100 mL IVPB (0 g Intravenous Stopped 02/12/22 1848)  acetaminophen (TYLENOL) tablet 650 mg (650 mg Oral Given 02/12/22 1846)  vancomycin (VANCOCIN) IVPB 1000 mg/200 mL premix (has no administration in time range)  sodium chloride 0.9 % bolus 1,000 mL (has no administration in time range)    ED Course/ Medical Decision Making/ A&P                           Medical Decision Making Amount and/or Complexity of Data Reviewed Labs: ordered. Radiology: ordered.  Risk OTC drugs. Prescription drug management.   BP (!) 145/81   Pulse (!) 122   Temp 100 F (37.8 C) (Oral)   Resp (!) 23    Wt 62.1 kg   SpO2 96%   5:52 PM This is a 51 year old female significant history spina bifida with paraplegia, neurogenic bladder, diabetes, hypertension, has indwelling Foley catheter who was brought here for evaluation of hematuria, along with lower abdominal discomfort.  Hematuria and overall Started today furthermore family also noticed left lower leg redness and swelling that began this morning.  Patient last had her Foley exchange approximate 5 days ago.  No report of fever nausea vomiting diarrhea.  Patient has a sacral wound that family change dressing on a daily basis and they have not noticed any worsening redness or drainage.  Patient was initially seen at urgent care center but was sent here after urinalysis shows possible UTI but left leg swelling and redness was concerning for the urgent care provider.  On exam, patient is resting comfortably appears to be in no acute discomfort.  She is however tachycardic, she does have tenderness to her suprapubic region on palpation.  She has an ostomy that put out dark-colored output which is not a usual according to family member.  She has indwelling Foley catheter with dark urine noted.  She has an oral temperature of 100.  She is mildly tachypneic with a respiratory rate of 123.  Her blood pressure is 145/81.  She is not hypoxic.  Left lower extremity with erythema edema and warmth from her foot extending towards her knee.  She has several shallow pressure ulcers to the lateral aspect of her L ankle.  I have initiate code sepsis and start patient on IV fluid as well as Rocephin.  DVT study of the left lower extremity have been ordered.  Patient given Tylenol for her fever.  8:30 PM Labs, EKG, and imaging independently viewed interpreted by me and I agree with radiologist interpretation.  Labs remarkable for elevated white count of 15.1 and fortunately her lactic acid is 1.5.  Hyperglycemia with a CBG of 189.  Urinalysis showed nitrite positive and  large leukocyte Estrace consistent with urinary tract infection.  Urine culture sent.  Chest x-ray without any acute process.  Venous Doppler study of the left lower extremity without evidence of DVT but it does demonstrate left leg soft tissue edema.  At this time, the patient will need to be admitted for treatment  of sepsis with likely source being urinary tract infection versus cellulitis of left lower extremity.  8:55 PM I appreciate consultation from [facility] Dr. Nevada Crane who agrees to admit pt to [facility].  She recommend adding vancomycin as well to cover for cellulitis.  Sepsis reassessment done   This patient presents to the ED for concern of sepsis, this involves an extensive number of treatment options, and is a complaint that carries with it a high risk of complications and morbidity.  The differential diagnosis includes UTI, cellulitis, intraabdominal infection, DVT  Co morbidities that complicate the patient evaluation paraplegia  Indwelling foley Additional history obtained:  Additional history obtained from family members External records from outside source obtained and reviewed including EMR along with prior labs and imaging  Lab Tests:  I Ordered, and personally interpreted labs.  The pertinent results include:  as above  Imaging Studies ordered:  I ordered imaging studies including venous doppler US LLE I independently visualized and interpreted imaging which showed no DVT I agree with the radiologist interpretation  Cardiac Monitoring:  The patient was maintained on a cardiac monitor.  I personally viewed and interpreted the cardiac monitored which showed an underlying rhythm of: sinus tachycardia  Medicines ordered and prescription drug management:  I ordered medication including rocephin  for UTI and cellulitis Reevaluation of the patient after these medicines showed that the patient improved I have reviewed the patients home medicines and  have made adjustments as needed  Test Considered: as above  Critical Interventions: IV abx  Sepsis protocol  Consultations Obtained:  I requested consultation with the hospitalist Dr. Nevada Crane,  and discussed lab and imaging findings as well as pertinent plan - they recommend: admission  Problem List / ED Course: UTI  Sepsis  LLE cellulitis  Reevaluation:  After the interventions noted above, I reevaluated the patient and found that they have :improved  Social Determinants of Health: none  Dispostion:  After consideration of the diagnostic results and the patients response to treatment, I feel that the patent would benefit from admission to [facility].         Final Clinical Impression(s) / ED Diagnoses Final diagnoses:  Lower urinary tract infectious disease  Cellulitis of left leg  Sepsis due to cellulitis [facility])    Rx / DC Orders ED Discharge Orders     None         Domenic Moras, PA-C 02/12/22 2057    Lennice Sites, DO 02/12/22 2108

## 2022-02-12 NOTE — ED Triage Notes (Signed)
Patient brought in via POV from home. Patient c.o hematuria since this AM, last changed on Monday. Also left leg swelling since today. Seen at urgent care and sent here for further eval

## 2022-02-12 NOTE — Sepsis Progress Note (Signed)
Monitoring for the code sepsis protocol. °

## 2022-02-13 ENCOUNTER — Inpatient Hospital Stay (HOSPITAL_COMMUNITY): Payer: Medicare (Managed Care)

## 2022-02-13 DIAGNOSIS — N319 Neuromuscular dysfunction of bladder, unspecified: Secondary | ICD-10-CM | POA: Diagnosis present

## 2022-02-13 DIAGNOSIS — D649 Anemia, unspecified: Secondary | ICD-10-CM | POA: Diagnosis present

## 2022-02-13 DIAGNOSIS — Z888 Allergy status to other drugs, medicaments and biological substances status: Secondary | ICD-10-CM | POA: Diagnosis not present

## 2022-02-13 DIAGNOSIS — L89154 Pressure ulcer of sacral region, stage 4: Secondary | ICD-10-CM | POA: Diagnosis present

## 2022-02-13 DIAGNOSIS — Z6841 Body Mass Index (BMI) 40.0 and over, adult: Secondary | ICD-10-CM | POA: Diagnosis not present

## 2022-02-13 DIAGNOSIS — L89314 Pressure ulcer of right buttock, stage 4: Secondary | ICD-10-CM | POA: Diagnosis present

## 2022-02-13 DIAGNOSIS — G8220 Paraplegia, unspecified: Secondary | ICD-10-CM | POA: Diagnosis present

## 2022-02-13 DIAGNOSIS — L539 Erythematous condition, unspecified: Secondary | ICD-10-CM | POA: Diagnosis present

## 2022-02-13 DIAGNOSIS — I10 Essential (primary) hypertension: Secondary | ICD-10-CM | POA: Diagnosis present

## 2022-02-13 DIAGNOSIS — L89892 Pressure ulcer of other site, stage 2: Secondary | ICD-10-CM | POA: Diagnosis present

## 2022-02-13 DIAGNOSIS — Z20822 Contact with and (suspected) exposure to covid-19: Secondary | ICD-10-CM | POA: Diagnosis present

## 2022-02-13 DIAGNOSIS — B9562 Methicillin resistant Staphylococcus aureus infection as the cause of diseases classified elsewhere: Secondary | ICD-10-CM | POA: Diagnosis not present

## 2022-02-13 DIAGNOSIS — Q052 Lumbar spina bifida with hydrocephalus: Secondary | ICD-10-CM | POA: Diagnosis not present

## 2022-02-13 DIAGNOSIS — M7989 Other specified soft tissue disorders: Secondary | ICD-10-CM | POA: Diagnosis present

## 2022-02-13 DIAGNOSIS — R7881 Bacteremia: Secondary | ICD-10-CM

## 2022-02-13 DIAGNOSIS — E1169 Type 2 diabetes mellitus with other specified complication: Secondary | ICD-10-CM | POA: Diagnosis present

## 2022-02-13 DIAGNOSIS — L89153 Pressure ulcer of sacral region, stage 3: Secondary | ICD-10-CM | POA: Diagnosis not present

## 2022-02-13 DIAGNOSIS — R310 Gross hematuria: Secondary | ICD-10-CM | POA: Diagnosis present

## 2022-02-13 DIAGNOSIS — Z881 Allergy status to other antibiotic agents status: Secondary | ICD-10-CM | POA: Diagnosis not present

## 2022-02-13 DIAGNOSIS — L03116 Cellulitis of left lower limb: Secondary | ICD-10-CM

## 2022-02-13 DIAGNOSIS — Z933 Colostomy status: Secondary | ICD-10-CM

## 2022-02-13 DIAGNOSIS — E1165 Type 2 diabetes mellitus with hyperglycemia: Secondary | ICD-10-CM | POA: Diagnosis present

## 2022-02-13 DIAGNOSIS — Z91048 Other nonmedicinal substance allergy status: Secondary | ICD-10-CM | POA: Diagnosis not present

## 2022-02-13 DIAGNOSIS — A4902 Methicillin resistant Staphylococcus aureus infection, unspecified site: Secondary | ICD-10-CM | POA: Diagnosis present

## 2022-02-13 DIAGNOSIS — N390 Urinary tract infection, site not specified: Secondary | ICD-10-CM | POA: Diagnosis present

## 2022-02-13 DIAGNOSIS — L89159 Pressure ulcer of sacral region, unspecified stage: Secondary | ICD-10-CM

## 2022-02-13 DIAGNOSIS — L89324 Pressure ulcer of left buttock, stage 4: Secondary | ICD-10-CM | POA: Diagnosis present

## 2022-02-13 LAB — CBG MONITORING, ED: Glucose-Capillary: 252 mg/dL — ABNORMAL HIGH (ref 70–99)

## 2022-02-13 LAB — BLOOD CULTURE ID PANEL (REFLEXED) - BCID2

## 2022-02-13 LAB — GLUCOSE, CAPILLARY
Glucose-Capillary: 207 mg/dL — ABNORMAL HIGH (ref 70–99)
Glucose-Capillary: 230 mg/dL — ABNORMAL HIGH (ref 70–99)

## 2022-02-13 MED ORDER — GERHARDT'S BUTT CREAM
TOPICAL_CREAM | Freq: Every day | CUTANEOUS | Status: DC
  Administered 2022-02-17: 1 via TOPICAL
  Filled 2022-02-13 (×2): qty 1

## 2022-02-13 MED ORDER — GLIPIZIDE ER 2.5 MG PO TB24
2.5000 mg | ORAL_TABLET | Freq: Every day | ORAL | Status: DC
  Filled 2022-02-13: qty 1

## 2022-02-13 MED ORDER — MIRABEGRON ER 25 MG PO TB24: 50.0000 mg | ORAL_TABLET | Freq: Once | ORAL | Status: DC

## 2022-02-13 MED ORDER — LORAZEPAM 2 MG/ML IJ SOLN
1.0000 mg | Freq: Once | INTRAMUSCULAR | Status: AC | PRN
  Administered 2022-02-13: 1 mg via INTRAVENOUS
  Filled 2022-02-13: qty 1

## 2022-02-13 MED ORDER — ZINC SULFATE 220 (50 ZN) MG PO CAPS
220.0000 mg | ORAL_CAPSULE | Freq: Every day | ORAL | Status: DC
  Administered 2022-02-13: 220 mg via ORAL
  Filled 2022-02-13: qty 1

## 2022-02-13 MED ORDER — POLYSACCHARIDE IRON COMPLEX 150 MG PO CAPS
300.0000 mg | ORAL_CAPSULE | Freq: Every day | ORAL | Status: DC
  Administered 2022-02-14 – 2022-02-17 (×4): 300 mg via ORAL
  Filled 2022-02-13 (×5): qty 2

## 2022-02-13 MED ORDER — GADOBUTROL 1 MMOL/ML IV SOLN
6.0000 mL | Freq: Once | INTRAVENOUS | Status: AC | PRN
  Administered 2022-02-13: 6 mL via INTRAVENOUS

## 2022-02-13 MED ORDER — SODIUM CHLORIDE 0.9 % IV SOLN
2.0000 g | Freq: Three times a day (TID) | INTRAVENOUS | Status: DC
  Administered 2022-02-13 – 2022-02-16 (×8): 2 g via INTRAVENOUS
  Filled 2022-02-13 (×8): qty 12.5

## 2022-02-13 MED ORDER — LINAGLIPTIN 5 MG PO TABS
5.0000 mg | ORAL_TABLET | Freq: Every day | ORAL | Status: DC
  Filled 2022-02-13: qty 1

## 2022-02-13 MED ORDER — ENOXAPARIN SODIUM 40 MG/0.4ML IJ SOSY
40.0000 mg | PREFILLED_SYRINGE | INTRAMUSCULAR | Status: DC
  Administered 2022-02-13 – 2022-02-16 (×4): 40 mg via SUBCUTANEOUS
  Filled 2022-02-13 (×4): qty 0.4

## 2022-02-13 MED ORDER — INSULIN ASPART 100 UNIT/ML IJ SOLN
0.0000 [IU] | Freq: Three times a day (TID) | INTRAMUSCULAR | Status: DC
  Administered 2022-02-13 – 2022-02-14 (×3): 5 [IU] via SUBCUTANEOUS
  Administered 2022-02-14 – 2022-02-15 (×3): 3 [IU] via SUBCUTANEOUS
  Administered 2022-02-15: 5 [IU] via SUBCUTANEOUS
  Administered 2022-02-16: 3 [IU] via SUBCUTANEOUS
  Administered 2022-02-16: 5 [IU] via SUBCUTANEOUS
  Administered 2022-02-16: 3 [IU] via SUBCUTANEOUS
  Administered 2022-02-17: 2 [IU] via SUBCUTANEOUS
  Administered 2022-02-17: 3 [IU] via SUBCUTANEOUS

## 2022-02-13 MED ORDER — POLYSACCHARIDE IRON COMPLEX 150 MG PO CAPS: 300.0000 mg | ORAL_CAPSULE | Freq: Once | ORAL | Status: DC

## 2022-02-13 MED ORDER — MIRABEGRON ER 25 MG PO TB24
50.0000 mg | ORAL_TABLET | Freq: Every day | ORAL | Status: DC
Start: 2022-02-14 — End: 2022-02-17
  Administered 2022-02-14 – 2022-02-17 (×4): 50 mg via ORAL
  Filled 2022-02-13: qty 2
  Filled 2022-02-13: qty 1
  Filled 2022-02-13 (×2): qty 2

## 2022-02-13 MED ORDER — LACTATED RINGERS IV SOLN: INTRAVENOUS | Status: DC

## 2022-02-13 MED ORDER — VANCOMYCIN HCL 1.25 G IV SOLR
1250.0000 mg | INTRAVENOUS | Status: DC
  Filled 2022-02-13: qty 25

## 2022-02-13 MED ORDER — ASPIRIN 325 MG PO TABS
325.0000 mg | ORAL_TABLET | Freq: Every day | ORAL | Status: DC
  Administered 2022-02-13: 325 mg via ORAL
  Filled 2022-02-13: qty 1

## 2022-02-13 MED ORDER — VANCOMYCIN HCL 1250 MG/250ML IV SOLN
1250.0000 mg | INTRAVENOUS | Status: DC
Start: 2022-02-13 — End: 2022-02-16
  Administered 2022-02-13 – 2022-02-15 (×3): 1250 mg via INTRAVENOUS
  Filled 2022-02-13 (×3): qty 250

## 2022-02-13 MED ORDER — METFORMIN HCL 500 MG PO TABS: 1000.0000 mg | ORAL_TABLET | Freq: Two times a day (BID) | ORAL | Status: DC

## 2022-02-13 MED ORDER — LINAGLIPTIN 5 MG PO TABS
5.0000 mg | ORAL_TABLET | Freq: Once | ORAL | Status: DC
Start: 2022-02-13 — End: 2022-02-13

## 2022-02-13 MED ORDER — METFORMIN HCL 500 MG PO TABS
1000.0000 mg | ORAL_TABLET | Freq: Two times a day (BID) | ORAL | Status: DC
  Administered 2022-02-13: 1000 mg via ORAL
  Filled 2022-02-13: qty 2

## 2022-02-13 MED ORDER — POLYSACCHARIDE IRON COMPLEX 150 MG PO CAPS
300.0000 mg | ORAL_CAPSULE | Freq: Every day | ORAL | Status: DC
  Filled 2022-02-13: qty 2

## 2022-02-13 MED ORDER — MIRABEGRON ER 50 MG PO TB24
50.0000 mg | ORAL_TABLET | Freq: Every day | ORAL | Status: DC
  Filled 2022-02-13: qty 1

## 2022-02-13 MED ORDER — DARIFENACIN HYDROBROMIDE ER 7.5 MG PO TB24
7.5000 mg | ORAL_TABLET | Freq: Every day | ORAL | Status: DC
  Filled 2022-02-13: qty 1

## 2022-02-13 MED ORDER — LORATADINE 10 MG PO TABS
10.0000 mg | ORAL_TABLET | Freq: Every day | ORAL | Status: DC
  Administered 2022-02-13 – 2022-02-17 (×5): 10 mg via ORAL
  Filled 2022-02-13 (×5): qty 1

## 2022-02-13 MED ORDER — GLIPIZIDE ER 2.5 MG PO TB24
2.5000 mg | ORAL_TABLET | Freq: Once | ORAL | Status: DC
Start: 2022-02-13 — End: 2022-02-13

## 2022-02-13 NOTE — Progress Notes (Signed)
Patient has arrived from [facility].  [facility] Admission paged for admission MD.  VS checked, cardiac monitoring established.  Patient denies pain, nausea.  Bradd Burner, RN

## 2022-02-13 NOTE — ED Notes (Signed)
First contact with patient. Pt. denies shob, is A&OX 4, resp. even/unlabored. Pt on cardiac monitor, call light within reach. Patient updated on plan of care. Will continue to monitor patient.  Pt requested and was provided with diet sprite product.

## 2022-02-13 NOTE — Consult Note (Signed)
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 Wound has been present for >5 years. Patient is followed by the outpatient wound care center at [facility]. Last seen in that setting by Provider Okey Dupre, MD on 01/20/22. Please see note from that encounter in the EMR. Wound type: Pressure Injury POA: Yes Measurement:Per Bedside RN on Flow sheet today, 19cm x 14cm x 3cm Wound bed:red, moist Drainage amount, consistency, odor) moderate serous Periwound:mild erythema Dressing procedure/placement/frequency: Wound care is currently every other day with a collagen dressing. Silver nitrate sticks are used for hypergranulation tissue. We do not stock a collagen dressing in house; I have provided guidance for the use of a calcium alginate dressing to be changed daily while in house.  Soap and water cleanse of the wound and buttocks to be be performed daily and Gerhart's Butt Cream (a compounded 1:1:1 product consisting of hydrocortisone cream: lotrimin cream: and zinc oxide) is to be applied to the periwound tissue (patient uses nystatin-triamcinolone) prior to the application of the dressing.  This is to be topped with dry gauze/ABD pad and secured how it best suits the patient: either with mesh briefs, minimal tape or a silicone foam as patient has tape and adhesive allergies. A mattress replacement with low air loss feature is provided.  WOC Nurse ostomy consult note Stoma type/location: LLQ colostomy Stomal assessment/size: Not measured today Peristomal assessment: Not seen today Treatment options for stomal/peristomal skin: Skin barrier ring, if desired Output : brown stool Ostomy pouching: 2pc. 2 and 3/4 inch ostomy pouching system. Pouch is [employer] [place] [phone], Skin barrier is [employer] [place] # 2 and if desired, a Skin barrier ring is [employer] [place] [phone]. Education provided: None Enrolled patient in [employer] DC program:No. Patient is established with a supplier.  WOC nursing team will not follow, but will remain available to this patient, the nursing and medical teams.  Thank you for inviting [place] to participate in this patient's Plan of Care.  Ladona Mow, MSN, RN, CNS, GNP, Leda Min, [employer], [employer] phone:  [phone]

## 2022-02-13 NOTE — Progress Notes (Signed)
Pharmacy Antibiotic Note  April Cabrera is a 51 y.o. female for which pharmacy has been consulted for vancomycin dosing for bacteremia.  Patient with a history of HTN, T2DM, Spina Bifida, neurogenic bladder with foley catheter and paraplegia presents . Patient presenting with Hematuria x 1day, Leg swelling and redness x 1 day, and tachycardia.  SCr 0.43 WBC 15.1; LA 1.5; T 99 F; HR 117>>102; RR 21>16 COVID/Flu - neg BCx 1/2 GPC -- BCID w/ staph aureus (mecA detected)  Plan: Ceftriaxone per MD Vancomycin 1000 mg once then 1250 mg q24hr (eAUC 507) unless change in renal function Trend WBC, Fever, Renal function, & Clinical course F/u cultures, clinical course, WBC, fever De-escalate when able Levels at steady state  Height: 5' 0.24" (153 cm) Weight: 62.1 kg (137 lb) IBW/kg (Calculated) : 46.04  Temp (24hrs), Avg:99.5 F (37.5 C), Min:99 F (37.2 C), Max:100 F (37.8 C)  Recent Labs  Lab 02/12/22 1745  WBC 15.1*  CREATININE 0.43*  LATICACIDVEN 1.5    Estimated Creatinine Clearance: 70.4 mL/min (A) (by C-G formula based on SCr of 0.43 mg/dL (L)).    Allergies  Allergen Reactions   Ciprofloxacin    Macrobid [Nitrofurantoin]    Wound Dressing Adhesive    Latex Rash    Antimicrobials this admission: ceftriaxone 7/15 >>  vancomycin 7/15 >>  Microbiology results: Pending  Thank you for allowing pharmacy to be a part of this patient's care.  Delmar Landau, PharmD, BCPS 02/13/2022 2:53 PM ED Clinical Pharmacist -  [phone]

## 2022-02-13 NOTE — ED Notes (Signed)
 urine hematuria  no odor

## 2022-02-13 NOTE — ED Notes (Addendum)
Pharmacy states that patient can take home medication since we do not have them in our pixis. States that 2 RN have to verify the medication. Micheal Nunlley Rn verified with with me. Patient took Myrbetriq 50mg  1 tab,glipizide er 2.5 mg 1 tab,Januvia 100 mg 1 tab ,Ferrex 150 cap ,Trospium chloride 20 mg 1 tab. Dr. wrote order also ok to give home meds Patient mom gave medication after it was verified

## 2022-02-13 NOTE — Progress Notes (Signed)
Pharmacy Brief Note - Antibiotics  Patient is currently on vancomycin for MRSA bacteremia, ceftriaxone for concern for UTI. For full history, see note by Delmar Landau, PharmD  Pharmacy consulted to dose cefepime. Ceftriaxone discontinued, start cefepime 2 g IV q8h.   Cindi Carbon, PharmD 02/13/22 5:26 PM

## 2022-02-13 NOTE — Progress Notes (Signed)
Patient's mother and caregiver April Cabrera says she replaced catheter Monday July 10 with a 28 G catheter with 30 cc balloon.  Bradd Burner, RN

## 2022-02-13 NOTE — H&P (Signed)
History and Physical    April Cabrera DGL:[id number] DOB: Dec 30, 1970 DOA: 02/12/2022  PCP: Malka So., MD  Patient coming from: home by way of [facility] ED   Chief Complaint: hematuria  HPI: April Cabrera is a 51 y.o. female with medical history significant for spina bifida, paraplegia, neurogenic bladder with suprapubic catheter, chronic sacral decubitus ulcer, recent hospitalization for skin infection and osteomyelitis of the pelvis, htn, dm, who presents with the above.  From 10/02/21 outpatient ID progress note, summarizing recent hospitalization: "was admitted to [facility] on 08/14/21 for I&D and placement for biodegradeable temporizing matrix. Op Cx + PsA and Corynebacterium. Pt started on vancomycin/zosyn IV then transitioned to vancomycin/cefepime on 08/16/21. CT imaging obtained on 08/17/21 for SBO/ileus with findings of OM of sacrum, coccyx, bilateral sacral ala, and bilateral iliac crests. Pt has h/o sacrum wound Cx+ MRSA and GBS on 07/15/2021. Pt then had I&D of sacral wound on 08/21/21 followed by split thickness skin graft (STSG) on 08/25/21. Pt transitioned to daptomycin and cefepime then d/c home on 08/30/21."  Patient lives with her mother. She was brought to the ED today for hematuria. Gets her suprapubic catheter changed every 2 weeks, most recently this past Monday. No clots but red. Some suprapubic discomfort. Denies fevers or chills. No cough. Not currnetly on abx.   ED Course:   Given vancomycin and ceftriaxone for presumed UTI  Review of Systems: As per HPI otherwise 10 point review of systems negative.    Past Medical History:  Diagnosis Date   DMII (diabetes mellitus, type 2) (HCC)    Paraplegia (HCC)    Spina bifida (HCC)     History reviewed. No pertinent surgical history.   reports that she has never smoked. She has never used smokeless tobacco. She reports that she does not drink alcohol and does not use drugs.  Allergies  Allergen Reactions    Ciprofloxacin Anaphylaxis   Nitrofurantoin Swelling   Tape Swelling and Other (See Comments)    Facial swelling/redness-pt denies swelling but does state all tape causes redness    Wound Dressing Adhesive Swelling and Other (See Comments)    Facial swelling and redness   Bacitracin Other (See Comments)    Redness    Losartan Other (See Comments)    GI upset    Latex Rash and Other (See Comments)    "Precautions" (?)   Neosporin Original [Neomycin-Bacitracin Zn-Polymyx] Rash and Other (See Comments)    Redness, also    History reviewed. No pertinent family history.  Prior to Admission medications   Medication Sig Start Date End Date Taking? Authorizing Provider  aspirin 325 MG tablet Take 1 tablet by mouth daily.    [provider]  cetirizine (ZYRTEC) 10 MG tablet Take 1 tablet by mouth daily. 06/09/16   [provider]  FERREX 150 150 MG capsule Take 300 mg by mouth daily. 01/25/22   [provider]  glipiZIDE (GLUCOTROL XL) 2.5 MG 24 hr tablet Take 1 tablet by mouth daily. 06/23/21   [provider]  JANUVIA 100 MG tablet Take 100 mg by mouth daily. 11/13/21   [provider]  metFORMIN (GLUCOPHAGE) 500 MG tablet Take 1,000 mg by mouth 2 (two) times daily. 10/16/21   [provider]  MYRBETRIQ 50 MG TB24 tablet Take 50 mg by mouth daily. 01/25/22   [provider]  trospium (SANCTURA) 20 MG tablet Take 20 mg by mouth 2 (two) times daily. 01/13/22   [provider]  zinc  sulfate 220 (50 Zn) MG capsule Take by mouth.    [provider]    Physical Exam: Vitals:   02/13/22 1400 02/13/22 1451 02/13/22 1501 02/13/22 1607  BP: 120/79   117/65  Pulse: (!) 102   (!) 106  Resp: 16   20  Temp:   98.6 F (37 C) 98.7 F (37.1 C)  TempSrc:   Oral Oral  SpO2: 96%   98%  Weight:      Height:  5' 0.24" (1.53 m)      Constitutional: No acute distress Head: Atraumatic Eyes: Conjunctiva clear ENM: Moist  mucous membranes. Normal dentition.  Neck: Supple Respiratory: Clear to auscultation bilaterally, no wheezing/rales/rhonchi. Normal respiratory effort. No accessory muscle use. . Cardiovascular: Regular rate and rhythm. No murmurs/rubs/gallops. Abdomen: soft, obese. Suprapubic catheter. Colostomy draining brown stool, c/d/i Musculoskeletal: contractures lower extremities. Decreased muscle tone.  Skin: severe sacral decubitus ulcer Extremities: erythema and swelling left lower extremity Neurologic: Alert,  Psychiatric: Normal insight and judgement.   Labs on Admission: I have personally reviewed following labs and imaging studies  CBC: Recent Labs  Lab 02/12/22 1745  WBC 15.1*  NEUTROABS 12.4*  HGB 10.6*  HCT 33.2*  MCV 78.7*  PLT [id number]*   Basic Metabolic Panel: Recent Labs  Lab 02/12/22 1745  NA 134*  K 3.8  CL 101  CO2 22  GLUCOSE 189*  BUN 14  CREATININE 0.43*  CALCIUM 9.2   GFR: Estimated Creatinine Clearance: 70.4 mL/min (A) (by C-G formula based on SCr of 0.43 mg/dL (L)). Liver Function Tests: Recent Labs  Lab 02/12/22 1745  AST 19  ALT 20  ALKPHOS 129*  BILITOT 0.4  PROT 8.2*  ALBUMIN 3.1*   No results for input(s): "LIPASE", "AMYLASE" in the last 168 hours. No results for input(s): "AMMONIA" in the last 168 hours. Coagulation Profile: Recent Labs  Lab 02/12/22 1803  INR 1.2   Cardiac Enzymes: No results for input(s): "CKTOTAL", "CKMB", "CKMBINDEX", "TROPONINI" in the last 168 hours. BNP (last 3 results) No results for input(s): "PROBNP" in the last 8760 hours. HbA1C: No results for input(s): "HGBA1C" in the last 72 hours. CBG: Recent Labs  Lab 02/13/22 1106  GLUCAP 252*   Lipid Profile: No results for input(s): "CHOL", "HDL", "LDLCALC", "TRIG", "CHOLHDL", "LDLDIRECT" in the last 72 hours. Thyroid Function Tests: No results for input(s): "TSH", "T4TOTAL", "FREET4", "T3FREE", "THYROIDAB" in the last 72 hours. Anemia Panel: No results for  input(s): "VITAMINB12", "FOLATE", "FERRITIN", "TIBC", "IRON", "RETICCTPCT" in the last 72 hours. Urine analysis:    Component Value Date/Time   COLORURINE YELLOW 02/12/2022 1730   APPEARANCEUR CLOUDY (A) 02/12/2022 1730   LABSPEC <=1.005 02/12/2022 1730   PHURINE 6.0 02/12/2022 1730   GLUCOSEU NEGATIVE 02/12/2022 1730   HGBUR LARGE (A) 02/12/2022 1730   BILIRUBINUR NEGATIVE 02/12/2022 1730   KETONESUR NEGATIVE 02/12/2022 1730   PROTEINUR 100 (A) 02/12/2022 1730   NITRITE POSITIVE (A) 02/12/2022 1730   LEUKOCYTESUR LARGE (A) 02/12/2022 1730    Radiological Exams on Admission: US Venous Img Lower  Left (DVT Study)  Result Date: 02/12/2022 CLINICAL DATA:  Left leg edema. EXAM: LEFT LOWER EXTREMITY VENOUS DOPPLER ULTRASOUND TECHNIQUE: Gray-scale sonography with compression, as well as color and duplex ultrasound, were performed to evaluate the deep venous system(s) from the level of the common femoral vein through the popliteal and proximal calf veins. COMPARISON:  None Available. FINDINGS: VENOUS Normal compressibility of the common femoral, superficial femoral, and popliteal veins, as well as the  visualized calf veins. Visualized portions of profunda femoral vein and great saphenous vein unremarkable. No filling defects to suggest DVT on grayscale or color Doppler imaging. Doppler waveforms show normal direction of venous flow, normal respiratory plasticity and response to augmentation. Limited views of the contralateral common femoral vein are unremarkable. OTHER Subcutaneous soft tissue edema is noted. Limitations: Diminished patient mobility. IMPRESSION: 1. No evidence of left lower extremity DVT. 2. Left leg soft tissue edema. Electronically Signed   By: Narda Rutherford M.D.   On: 02/12/2022 19:46   DG Chest Port 1 View  Result Date: 02/12/2022 CLINICAL DATA:  Hematuria, low-grade fever, sepsis EXAM: PORTABLE CHEST 1 VIEW COMPARISON:  07/06/2017 FINDINGS: Single frontal view of the chest  demonstrates an unremarkable cardiac silhouette. Shunt catheters are unchanged. Chronic elevation the right hemidiaphragm. No acute airspace disease, effusion, or pneumothorax. No acute bony abnormality. IMPRESSION: 1. Stable chest, no acute process. Electronically Signed   By: Sharlet Salina M.D.   On: 02/12/2022 17:57    EKG: Independently reviewed. Sinus tachycardia  Assessment/Plan Principal Problem:   MRSA bacteremia Active Problems:   Sepsis (HCC)   Suprapubic catheter (HCC)   Essential hypertension   Morbid obesity with BMI of 45.0-49.9, adult (HCC)   Type 2 diabetes mellitus with hyperglycemia, without long-term current use of insulin (HCC)   Cellulitis of left lower extremity   Sacral decubitus ulcer   # MRSA bacteremia In 2/2 blood cultures. 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 Urine may also be source - continue vancomycin, f/u culture results - consider ID consult tomorrow  # Sacral decubitus ulcer Severe. Treated earlier this year. Appears cultures grew pseudomonas, conrnybacterium, mrsa, and gbs. - cont vanc, convert ceftriaxone to cefepime - mri sacrum/pelvis - wound care consult  # Left lower extremity cellulitis PVL neg for DVT - vanc/cefepime as above  # Hematuria # Neurogenic bladder # Suprapubic catheter Presented today for hematuria. Found to have bacteremia.  - f/u culture - cont vanc/cefepime - consider urology consult for catheter exchange  # Colostomy status - colostomy care and ostomy rn consulted  # T2DM Here glucoses mild elevation - SSI, hold home orals   DVT prophylaxis: lovenox Code Status: full  Family Communication: mother updated @ bedside  Consults called: none   Level of care: Progressive Status is: Inpatient Remains inpatient appropriate because: severity of illness    Silvano Bilis MD [facility] Pager [phone]  If 7PM-7AM, please contact  night-coverage [email or web address] Password [facility]  02/13/2022, 4:36 PM

## 2022-02-13 NOTE — ED Notes (Signed)
Waiting on pharmacy to verify medication.

## 2022-02-13 NOTE — ED Notes (Addendum)
Wash cloths and soap for patient to wash her face

## 2022-02-14 DIAGNOSIS — R7881 Bacteremia: Secondary | ICD-10-CM | POA: Diagnosis not present

## 2022-02-14 DIAGNOSIS — B9562 Methicillin resistant Staphylococcus aureus infection as the cause of diseases classified elsewhere: Secondary | ICD-10-CM | POA: Diagnosis not present

## 2022-02-14 LAB — CBC
HCT: 29.1 % — ABNORMAL LOW (ref 36.0–46.0)
Hemoglobin: 8.9 g/dL — ABNORMAL LOW (ref 12.0–15.0)
MCH: 24.9 pg — ABNORMAL LOW (ref 26.0–34.0)
MCHC: 30.6 g/dL (ref 30.0–36.0)
MCV: 81.3 fL (ref 80.0–100.0)
Platelets: 395 10*3/uL (ref 150–400)
RBC: 3.58 MIL/uL — ABNORMAL LOW (ref 3.87–5.11)
RDW: 18.6 % — ABNORMAL HIGH (ref 11.5–15.5)
WBC: 9 10*3/uL (ref 4.0–10.5)
nRBC: 0 % (ref 0.0–0.2)

## 2022-02-14 LAB — GLUCOSE, CAPILLARY
Glucose-Capillary: 203 mg/dL — ABNORMAL HIGH (ref 70–99)
Glucose-Capillary: 242 mg/dL — ABNORMAL HIGH (ref 70–99)
Glucose-Capillary: 180 mg/dL — ABNORMAL HIGH (ref 70–99)
Glucose-Capillary: 193 mg/dL — ABNORMAL HIGH (ref 70–99)

## 2022-02-14 LAB — URINE CULTURE

## 2022-02-14 LAB — BASIC METABOLIC PANEL
Anion gap: 8 (ref 5–15)
BUN: 13 mg/dL (ref 6–20)
CO2: 22 mmol/L (ref 22–32)
Calcium: 8.2 mg/dL — ABNORMAL LOW (ref 8.9–10.3)
Chloride: 112 mmol/L — ABNORMAL HIGH (ref 98–111)
Creatinine, Ser: 0.6 mg/dL (ref 0.44–1.00)
GFR, Estimated: >60 mL/min (ref >60)
Glucose, Bld: 179 mg/dL — ABNORMAL HIGH (ref 70–99)
Potassium: 3.1 mmol/L — ABNORMAL LOW (ref 3.5–5.1)
Sodium: 142 mmol/L (ref 135–145)

## 2022-02-14 LAB — HIV ANTIBODY (ROUTINE TESTING W REFLEX): HIV Screen 4th Generation wRfx: NONREACTIVE

## 2022-02-14 MED ORDER — DARIFENACIN HYDROBROMIDE ER 7.5 MG PO TB24
7.5000 mg | ORAL_TABLET | Freq: Every day | ORAL | Status: DC
  Administered 2022-02-14 – 2022-02-17 (×4): 7.5 mg via ORAL
  Filled 2022-02-14 (×4): qty 1

## 2022-02-14 MED ORDER — POTASSIUM CHLORIDE CRYS ER 20 MEQ PO TBCR
40.0000 meq | EXTENDED_RELEASE_TABLET | Freq: Once | ORAL | Status: AC
Start: 2022-02-14 — End: 2022-02-14
  Administered 2022-02-14: 40 meq via ORAL
  Filled 2022-02-14: qty 2

## 2022-02-14 MED ORDER — PNEUMOCOCCAL 20-VAL CONJ VACC 0.5 ML IM SUSY
0.5000 mL | PREFILLED_SYRINGE | INTRAMUSCULAR | Status: DC
  Filled 2022-02-14 (×2): qty 0.5

## 2022-02-14 MED ORDER — GLIPIZIDE ER 2.5 MG PO TB24
2.5000 mg | ORAL_TABLET | Freq: Every day | ORAL | Status: DC
Start: 2022-02-14 — End: 2022-02-17
  Administered 2022-02-14 – 2022-02-17 (×4): 2.5 mg via ORAL
  Filled 2022-02-14 (×4): qty 1

## 2022-02-14 MED ORDER — LINAGLIPTIN 5 MG PO TABS
5.0000 mg | ORAL_TABLET | Freq: Every day | ORAL | Status: DC
Start: 2022-02-14 — End: 2022-02-17
  Administered 2022-02-14 – 2022-02-17 (×4): 5 mg via ORAL
  Filled 2022-02-14 (×4): qty 1

## 2022-02-14 NOTE — Progress Notes (Signed)
PROGRESS NOTE    April ShiverJennifer Cabrera  ZOX:[id number] DOB: 1971-05-26 DOA: 02/12/2022 PCP: Malka SoJobe, Daniel B., MD   Brief Narrative: 51 year old female with history of paraplegia and neurogenic bladder with suprapubic catheter chronic sacral decubitus ulcer spina bifid lives at home with her mother admitted with hematuria.  Patient had recent hospitalization for osteomyelitis of the pelvis.  She has a history significant for diabetes and hypertension.  Patient was also treated with daptomycin and cefepime in January 2023. Patient was brought to the ED by family due to gross hematuria.  Patient has a suprapubic catheter changed every 2 weeks.  There were no clots observed.  She had some suprapubic pain and tenderness.  No fever chills reported by the family. She received vancomycin and Rocephin in the ER. 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 Urine culture with multiple species. Leukocytosis improved to 9.0 from 15.1. Patient with history of osteomyelitis of the pelvis.  #2 hypokalemia potassium 3.1 replete.  Check mag level.  #3 chronic anemia no evidence of active bleeding.  Had some gross hematuria on admission.  Hemoglobin stable.  #4 stage IV sacral decubitus ulcer appreciate wound care follow-up.  On vancomycin and cefepime. MRI of the sacrum and pelvis-large chronic sacral decubitus ulcer with small amount of fluid along the left margin of the ulcer base.  No well-defined or rim-enhancing fluid collection.  Surgical absence of the distal sacrum and coccyx no evidence of acute sacral  osteomyelitis.  Congenital spina bifida.  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  #5 gross hematuria in the setting of neurogenic bladder and suprapubic catheter.  Urine culture with no growth.  Her catheter was changed past Monday, 02/08/2022.  #6 history of colostomy  #7 type 2 diabetes-CBG (last 3) on SSI Restart glipizide and Tradjenta Hold metformin Recent Labs    02/13/22 2203 02/14/22 0811 02/14/22 1141  GLUCAP 230* 203* 242*     Pressure Injury 02/13/22 Buttocks Right;Left;Upper;Mid;Lower Stage 4 - Full thickness tissue loss with exposed bone, tendon or muscle. (Active)  02/13/22 1741  Location: Buttocks  Location Orientation: Right;Left;Upper;Mid;Lower  Staging: Stage 4 - Full thickness tissue loss with exposed bone, tendon or muscle.  Wound Description (Comments):   Present on Admission: Yes  Dressing Type Foam - Lift dressing to assess site every shift 02/14/22 0800     Pressure Injury 02/13/22 Knee Anterior;Left Stage 2 -  Partial thickness loss of dermis presenting as a shallow open injury with a red, pink wound bed without slough. (Active)  02/13/22 1742  Location: Knee  Location Orientation: Anterior;Left  Staging: Stage 2 -  Partial thickness loss of dermis presenting as a shallow open injury with a red, pink wound bed without slough.  Wound Description (Comments):   Present on Admission: Yes  Dressing Type Foam - Lift dressing to assess site every shift 02/14/22 0800     Estimated body mass index is 26.55 kg/m as calculated from the following:   Height as of this encounter: 5' 0.24" (1.53 m).   Weight as of this  encounter: 62.1 kg.  DVT prophylaxis: Lovenox  code Status: Full code  family Communication: Her brother-in-law was at bedside disposition Plan:  Status is: Inpatient Remains inpatient appropriate because: Bacteremia   Consultants:  Will consult ID  Procedures: None Antimicrobials: Vancomycin and  cefepime  Subjective: She is just waking up from sleep her family by the bedside She is awake and alert and able to answer all my questions appropriately and follow commands  Objective: Vitals:   02/13/22 1607 02/13/22 2146 02/14/22 0026 02/14/22 0437  BP: 117/65 118/65 114/74 115/66  Pulse: (!) 106 (!) 109 (!) 105 (!) 107  Resp: 20 18 18 18   Temp: 98.7 F (37.1 C) 100 F (37.8 C) 99 F (37.2 C) 98.7 F (37.1 C)  TempSrc: Oral Oral Oral Oral  SpO2: 98% 97% 95% 96%  Weight:      Height:        Intake/Output Summary (Last 24 hours) at 02/14/2022 1125 Last data filed at 02/14/2022 0800 Gross per 24 hour  Intake 2133.58 ml  Output 1350 ml  Net 783.58 ml   Filed Weights   02/12/22 1743  Weight: 62.1 kg    Examination:  General exam: Appears calm and comfortable  Respiratory system: Clear to auscultation. Respiratory effort normal. Cardiovascular system: S1 & S2 heard, RRR. No JVD, murmurs, rubs, gallops or clicks. No pedal edema. Gastrointestinal system: Abdomen is nondistended, soft and nontender. No organomegaly or masses felt. Normal bowel sounds heard.  Suprapubic catheter in place Central nervous system: Alert and oriented. No focal neurological deficits. Extremities: Contracted Skin: No rashes, lesions or ulcers Psychiatry: Judgement and insight appear normal. Mood & affect appropriate.     Data Reviewed: I have personally reviewed following labs and imaging studies  CBC: Recent Labs  Lab 02/12/22 1745 02/14/22 0345  WBC 15.1* 9.0  NEUTROABS 12.4*  --   HGB 10.6* 8.9*  HCT 33.2* 29.1*  MCV 78.7* 81.3  PLT 433* 395   Basic Metabolic Panel: Recent Labs  Lab 02/12/22 1745 02/14/22 0345  NA 134* 142  K 3.8 3.1*  CL 101 112*  CO2 22 22  GLUCOSE 189* 179*  BUN 14 13  CREATININE 0.43* 0.60  CALCIUM 9.2 8.2*   GFR: Estimated Creatinine Clearance: 70.4 mL/min (by C-G formula based on SCr of 0.6 mg/dL). Liver Function Tests: Recent Labs  Lab  02/12/22 1745  AST 19  ALT 20  ALKPHOS 129*  BILITOT 0.4  PROT 8.2*  ALBUMIN 3.1*   No results for input(s): "LIPASE", "AMYLASE" in the last 168 hours. No results for input(s): "AMMONIA" in the last 168 hours. Coagulation Profile: Recent Labs  Lab 02/12/22 1803  INR 1.2   Cardiac Enzymes: No results for input(s): "CKTOTAL", "CKMB", "CKMBINDEX", "TROPONINI" in the last 168 hours. BNP (last 3 results) No results for input(s): "PROBNP" in the last 8760 hours. HbA1C: No results for input(s): "HGBA1C" in the last 72 hours. CBG: Recent Labs  Lab 02/13/22 1106 02/13/22 1929 02/13/22 2203 02/14/22 0811  GLUCAP 252* 207* 230* 203*   Lipid Profile: No results for input(s): "CHOL", "HDL", "LDLCALC", "TRIG", "CHOLHDL", "LDLDIRECT" in the last 72 hours. Thyroid Function Tests: No results for input(s): "TSH", "T4TOTAL", "FREET4", "T3FREE", "THYROIDAB" in the last 72 hours. Anemia Panel: No results for input(s): "VITAMINB12", "FOLATE", "FERRITIN", "TIBC", "IRON", "RETICCTPCT" in the last 72 hours. Sepsis Labs: Recent Labs  Lab 02/12/22 1745  LATICACIDVEN 1.5    Recent Results (from the past 240 hour(s))  Urine Culture  Status: Abnormal   Collection Time: 02/12/22  5:30 PM   Specimen: In/Out Cath Urine  Result Value Ref Range Status   Specimen Description   Final    IN/OUT CATH URINE Performed at [facility], [address] [place], [place] [address]    Special Requests   Final    NONE Performed at [facility], [address] [place], [place] [address]    Culture MULTIPLE SPECIES PRESENT, SUGGEST RECOLLECTION (A)  Final   Report Status 02/14/2022 FINAL  Final  Blood Culture (routine x 2)     Status: Abnormal (Preliminary result)   Collection Time: 02/12/22  5:58 PM   Specimen: BLOOD  Result Value Ref Range Status   Specimen Description   Final    BLOOD RIGHT ANTECUBITAL Performed at [facility], [address], [place], [place]  [address]    Special Requests   Final    Blood Culture adequate volume BOTTLES DRAWN AEROBIC AND ANAEROBIC Performed at [facility], [address] [place], [place] [address]    Culture  Setup Time   Final    GRAM POSITIVE COCCI IN CLUSTERS IN BOTH AEROBIC AND ANAEROBIC BOTTLES CRITICAL RESULT CALLED TO, READ BACK BY AND VERIFIED WITH: RN S.JOUGE AT 1315 ON 02/13/2022 BY T.SAAD.    Culture (A)  Final    STAPHYLOCOCCUS AUREUS SUSCEPTIBILITIES TO FOLLOW Performed at [facility], [address], [place], [place] [address]    Report Status PENDING  Incomplete  Blood Culture ID Panel (Reflexed)     Status: Abnormal   Collection Time: 02/12/22  5:58 PM  Result Value Ref Range Status   Enterococcus faecalis NOT DETECTED NOT DETECTED Final   Enterococcus Faecium NOT DETECTED NOT DETECTED Final   Listeria monocytogenes NOT DETECTED NOT DETECTED Final   Staphylococcus species DETECTED (A) NOT DETECTED Final    Comment: CRITICAL RESULT CALLED TO, READ BACK BY AND VERIFIED WITH: RN S.JOUGE AT 1315 ON 02/13/2022 BY T.SAAD.    Staphylococcus aureus (BCID) DETECTED (A) NOT DETECTED Final    Comment: Methicillin (oxacillin)-resistant Staphylococcus aureus (MRSA). MRSA is predictably resistant to beta-lactam antibiotics (except ceftaroline). Preferred therapy is vancomycin unless clinically contraindicated. Patient requires contact precautions if  hospitalized. CRITICAL RESULT CALLED TO, READ BACK BY AND VERIFIED WITH: RN S.JOUGE AT 1315 ON 02/13/2022 BY T.SAAD.    Staphylococcus epidermidis NOT DETECTED NOT DETECTED Final   Staphylococcus lugdunensis NOT DETECTED NOT DETECTED Final   Streptococcus species NOT DETECTED NOT DETECTED Final   Streptococcus agalactiae NOT DETECTED NOT DETECTED Final   Streptococcus pneumoniae NOT DETECTED NOT DETECTED Final   Streptococcus pyogenes NOT DETECTED NOT DETECTED Final   A.calcoaceticus-baumannii NOT DETECTED NOT DETECTED Final    Bacteroides fragilis NOT DETECTED NOT DETECTED Final   Enterobacterales NOT DETECTED NOT DETECTED Final   Enterobacter cloacae complex NOT DETECTED NOT DETECTED Final   Escherichia coli NOT DETECTED NOT DETECTED Final   Klebsiella aerogenes NOT DETECTED NOT DETECTED Final   Klebsiella oxytoca NOT DETECTED NOT DETECTED Final   Klebsiella pneumoniae NOT DETECTED NOT DETECTED Final   Proteus species NOT DETECTED NOT DETECTED Final   Salmonella species NOT DETECTED NOT DETECTED Final   Serratia marcescens NOT DETECTED NOT DETECTED Final   Haemophilus influenzae NOT DETECTED NOT DETECTED Final   Neisseria meningitidis NOT DETECTED NOT DETECTED Final   Pseudomonas aeruginosa NOT DETECTED NOT DETECTED Final   Stenotrophomonas maltophilia NOT DETECTED NOT DETECTED  Final   Candida albicans NOT DETECTED NOT DETECTED Final   Candida auris NOT DETECTED NOT DETECTED Final   Candida glabrata NOT DETECTED NOT DETECTED Final   Candida krusei NOT DETECTED NOT DETECTED Final   Candida parapsilosis NOT DETECTED NOT DETECTED Final   Candida tropicalis NOT DETECTED NOT DETECTED Final   Cryptococcus neoformans/gattii NOT DETECTED NOT DETECTED Final   Meth resistant mecA/C and MREJ DETECTED (A) NOT DETECTED Final    Comment: CRITICAL RESULT CALLED TO, READ BACK BY AND VERIFIED WITH: RN S.JOUGE AT 1315 ON 02/13/2022 BY T.SAAD. Performed at [facility], [address], [place], [place] [address]   Blood Culture (routine x 2)     Status: None (Preliminary result)   Collection Time: 02/12/22  6:10 PM   Specimen: BLOOD  Result Value Ref Range Status   Specimen Description   Final    BLOOD LEFT ANTECUBITAL Performed at [facility], [address], [place], [place] [address]    Special Requests   Final    Blood Culture results may not be optimal due to an inadequate volume of blood received in culture bottles BOTTLES DRAWN AEROBIC AND ANAEROBIC Performed at [facility], [address] [place], [place] [address]    Culture  Setup Time   Final    GRAM POSITIVE COCCI IN CLUSTERS AEROBIC BOTTLE ONLY CRITICAL VALUE NOTED.  VALUE IS CONSISTENT WITH PREVIOUSLY REPORTED AND CALLED VALUE. Performed at [facility], [address], [place], [place] [address]    Culture GRAM POSITIVE COCCI  Final   Report Status PENDING  Incomplete  Resp Panel by RT-PCR (Flu A&B, Covid)     Status: None   Collection Time: 02/12/22  6:19 PM   Specimen: Nasal Swab  Result Value Ref Range Status   SARS Coronavirus 2 by RT PCR NEGATIVE NEGATIVE Final    Comment: (NOTE) SARS-CoV-2 target nucleic acids are NOT DETECTED.  The SARS-CoV-2 RNA is generally detectable in upper respiratory specimens during the acute phase of infection. The lowest concentration of SARS-CoV-2 viral copies this assay can detect is 138 copies/mL. A negative result does not preclude SARS-Cov-2 infection and should not be used as the sole basis for treatment or other patient management decisions. A negative result may occur with  improper specimen collection/handling, submission of specimen other than nasopharyngeal swab, presence of viral mutation(s) within the areas targeted by this assay, and inadequate number of viral copies(<138 copies/mL). A negative result must be combined with clinical observations, patient history, and epidemiological information. The expected result is Negative.  Fact Sheet for Patients:  BloggerCourse.com  Fact Sheet for Healthcare Providers:  SeriousBroker.it  This test is no t yet approved or cleared by the [place] FDA and  has been authorized for detection and/or diagnosis of SARS-CoV-2 by FDA under an Emergency Use Authorization (EUA). This EUA will remain  in effect (meaning this test can be used) for the duration of the COVID-19 declaration under Section 564(b)(1) of the Act, 21 U.S.C.section 360bbb-3(b)(1), unless the  authorization is terminated  or revoked sooner.       Influenza A by PCR NEGATIVE NEGATIVE Final   Influenza B by PCR NEGATIVE NEGATIVE Final    Comment: (NOTE) The Xpert Xpress SARS-CoV-2/FLU/RSV plus assay is intended as an aid in the diagnosis of influenza from Nasopharyngeal swab specimens and should not be used as a sole basis for treatment. Nasal washings and aspirates are unacceptable for  Xpert Xpress SARS-CoV-2/FLU/RSV testing.  Fact Sheet for Patients: BloggerCourse.com  Fact Sheet for Healthcare Providers: SeriousBroker.it  This test is not yet approved or cleared by the [place] FDA and has been authorized for detection and/or diagnosis of SARS-CoV-2 by FDA under an Emergency Use Authorization (EUA). This EUA will remain in effect (meaning this test can be used) for the duration of the COVID-19 declaration under Section 564(b)(1) of the Act, 21 U.S.C. section 360bbb-3(b)(1), unless the authorization is terminated or revoked.  Performed at [facility], [address] [place], [place] [address]          Radiology Studies: MR PELVIS W WO CONTRAST  Result Date: 02/14/2022 CLINICAL DATA:  Sacral wound.  Osteomyelitis EXAM: MRI PELVIS WITHOUT AND WITH CONTRAST TECHNIQUE: Multiplanar multisequence MR imaging of the pelvis was performed both before and after administration of intravenous contrast. CONTRAST:  22mL GADAVIST GADOBUTROL 1 MMOL/ML IV SOLN COMPARISON:  CT 08/19/2021 FINDINGS: Urinary Tract: Suprapubic catheter within the urinary bladder. No hydronephrosis. Bowel: Partially imaged bowel containing left lower abdominal wall hernia. No evidence of bowel obstruction or active bowel inflammation within the pelvis. Vascular/Lymphatic: Several prominent intrapelvic lymph nodes including 10 mm right external iliac chain node. No significant vascular abnormality evident by MRI. Reproductive:  No mass or  other significant abnormality Other:  No free fluid within the pelvis. Musculoskeletal: Large chronic sacral decubitus ulcer. Mild postcontrast enhancement along the ulcer base. Small amount of fluid along the left margin of the ulcer base. No well-defined or rim enhancing fluid collection. There is truncation of the sacrum below the S3 level with absence of the coccyx, likely postsurgical. No bone marrow edema or enhancement to suggest acute osteomyelitis at this time. Changes of spina bifida within the lumbar spine. Bilateral hip dysplasia with moderate-advanced degenerative changes of both hips. No significant hip joint effusion. Right SI joint is partially fused. No SI joint effusion or diastasis. Diffuse fatty atrophy of the visualized musculature. IMPRESSION: 1. Large chronic sacral decubitus ulcer with small amount of fluid along the left margin of the ulcer base. No well-defined or rim enhancing fluid collection. 2. 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 No evidence of acute sacral osteomyelitis at this time. 3. Congenital spina bifida. 4. Bilateral hip dysplasia with moderate-advanced degenerative changes of both hips. Electronically Signed   By: Duanne Guess D.O.   On: 02/14/2022 09:18   US Venous Img Lower  Left (DVT Study)  Result Date: 02/12/2022 CLINICAL DATA:  Left leg edema. EXAM: LEFT LOWER EXTREMITY VENOUS DOPPLER ULTRASOUND TECHNIQUE: Gray-scale sonography with compression, as well as color and duplex ultrasound, were performed to evaluate the deep venous system(s) from the level of the common femoral vein through the popliteal and proximal calf veins. COMPARISON:  None Available. FINDINGS: VENOUS Normal compressibility of the common femoral, superficial femoral, and popliteal veins, as well as the visualized calf veins. Visualized portions of profunda femoral vein and great saphenous vein unremarkable. No filling defects to suggest DVT on grayscale or color Doppler imaging.  Doppler waveforms show normal direction of venous flow, normal respiratory plasticity and response to augmentation. Limited views of the contralateral common femoral vein are unremarkable. OTHER Subcutaneous soft tissue edema is noted. Limitations: Diminished patient mobility. IMPRESSION: 1. No evidence of left lower extremity DVT. 2. Left leg soft tissue edema. Electronically Signed   By: Narda Rutherford M.D.   On: 02/12/2022 19:46   DG Chest Port 1 View  Result Date: 02/12/2022  CLINICAL DATA:  Hematuria, low-grade fever, sepsis EXAM: PORTABLE CHEST 1 VIEW COMPARISON:  07/06/2017 FINDINGS: Single frontal view of the chest demonstrates an unremarkable cardiac silhouette. Shunt catheters are unchanged. Chronic elevation the right hemidiaphragm. No acute airspace disease, effusion, or pneumothorax. No acute bony abnormality. IMPRESSION: 1. Stable chest, no acute process. Electronically Signed   By: Sharlet Salina M.D.   On: 02/12/2022 17:57        Scheduled Meds:  enoxaparin (LOVENOX) injection  40 mg Subcutaneous Q24H   Gerhardt's butt cream   Topical Daily   insulin aspart  0-15 Units Subcutaneous TID WC   iron polysaccharides  300 mg Oral Daily   loratadine  10 mg Oral Daily   mirabegron ER  50 mg Oral Daily   mirabegron ER  50 mg Oral Once   [START ON 02/15/2022] pneumococcal 20-valent conjugate vaccine  0.5 mL Intramuscular Tomorrow-1000   Continuous Infusions:  ceFEPime (MAXIPIME) IV 2 g (02/14/22 0522)   lactated ringers 100 mL/hr at 02/14/22 0958   vancomycin 1,250 mg (02/13/22 2206)     LOS: 1 day    Time spent: 38 min  Alwyn Ren, MD 02/14/2022, 11:25 AM

## 2022-02-14 NOTE — Consult Note (Signed)
[facility]    Date of Admission:  02/12/2022   Total days of inpatient antibiotics 1        Reason for Consult: MRSA bacteremia    Principal Problem:   MRSA bacteremia Active Problems:   Sepsis (HCC)   Suprapubic catheter (HCC)   Essential hypertension   Morbid obesity with BMI of 45.0-49.9, adult (HCC)   Type 2 diabetes mellitus with hyperglycemia, without long-term current use of insulin (HCC)   Cellulitis of left lower extremity   Sacral decubitus ulcer   Colostomy in place [facility])   Assessment: 17 FY with spina bifida, chronic sacral wounds requiring multiple I&D in January 2023 with OR Cx+ PsA and corynebacterium and split thickness graft on 08/25/21(Followed by [facility] ID) treated with daptomycin +cefepime x 6 weeks EOT 10/02/21 admitted with MRSA bacteremia.  #MRSA bacteremia #Sacral wound SP graft #Colostomy #Neurogenic bladder SP SPC #LLE cellutis - MRI of sacrum and pelvis showed large chronic sacral decubitus ulcer and small mount of fluid around the left margin of sacral base.  Surgical absence of distal sacrum and coccyx, no acute osteomyelitis. - Chest x-ray no acute abnormality - Urine cultures grew multiple species, suprapubic catheter last changed on 7/10 - LLE US showed no DVT, left leg soft tissue swelling -OR Cx from 08/21/21 grew PSA and corynebacterium as such will continue cefepime. Suspect MRSA bacteremia 2/2 LLE cellulitis as it is warm and erythematous.  Recommendations:  -Continue vancomycin and cefepime -Repeat blood Cx -Obtain TTE  Microbiology:   Antibiotics: Vancomycin 7/14 CTX 7/14 Cefepime 7/15-p  Cultures: Blood 7/14 2/2 MRSA Urine Multiple species Other   HPI: April Cabrera is a 51 y.o. female with diabetes with A1c 7.3, hypertension, spina bifida with paraplegia and neurogenic bladder status post suprapubic catheter, chronic sacral wound status post I&D with OR cultures growing Pseudomonas and  corynebacterium repeat I&D on 1/20 split-thickness skin graft on 08/25/2021, patient placed on daptomycin and cefepime to complete 6 weeks of antibiotics EOT is 10/02/2021 followed [facility] Infectious disease, last seen on 10/02/2021 when PICC line was removed presented with gross hematuria and admitted for blood cultures growing Staph aureus.  ID was engaged as patient has MRSA bacteremia.   Review of Systems: Review of Systems  All other systems reviewed and are negative.   Past Medical History:  Diagnosis Date   DMII (diabetes mellitus, type 2) (HCC)    Paraplegia (HCC)    Spina bifida (HCC)     Social History   Tobacco Use   Smoking status: Never   Smokeless tobacco: Never  Substance Use Topics   Alcohol use: Never   Drug use: Never    History reviewed. No pertinent family history. Scheduled Meds:  enoxaparin (LOVENOX) injection  40 mg Subcutaneous Q24H   Gerhardt's butt cream   Topical Daily   glipiZIDE  2.5 mg Oral Q breakfast   insulin aspart  0-15 Units Subcutaneous TID WC   iron polysaccharides  300 mg Oral Daily   linagliptin  5 mg Oral Daily   loratadine  10 mg Oral Daily   mirabegron ER  50 mg Oral Daily   mirabegron ER  50 mg Oral Once   [START ON 02/15/2022] pneumococcal 20-valent conjugate vaccine  0.5 mL Intramuscular Tomorrow-1000   Continuous Infusions:  ceFEPime (MAXIPIME) IV 2 g (02/14/22 1328)   lactated ringers 100 mL/hr at 02/14/22 0958   vancomycin 1,250 mg (  02/13/22 2206)   PRN Meds:.acetaminophen Allergies  Allergen Reactions   Ciprofloxacin Anaphylaxis   Nitrofurantoin Swelling and Other (See Comments)    Face became swollen and the throat started to swell (Benadryl needed)   Tape Swelling and Other (See Comments)    Facial swelling/redness-pt denies swelling but does state all tape causes redness    Wound Dressing Adhesive Swelling and Other (See Comments)    Facial swelling and redness   Bacitracin Other (See  Comments)    Redness    Losartan Other (See Comments)    GI upset    Latex Rash and Other (See Comments)    "Precautions" (?)   Neosporin Original [Neomycin-Bacitracin Zn-Polymyx] Rash and Other (See Comments)    Redness, also    OBJECTIVE: Blood pressure 117/68, pulse 100, temperature 99.1 F (37.3 C), temperature source Oral, resp. rate 16, height 5' 0.24" (1.53 m), weight 62.1 kg, SpO2 97 %.  Physical Exam Constitutional:      Appearance: Normal appearance.  HENT:     Head: Normocephalic and atraumatic.     Right Ear: Tympanic membrane normal.     Left Ear: Tympanic membrane normal.     Nose: Nose normal.     Mouth/Throat:     Mouth: Mucous membranes are moist.  Eyes:     Extraocular Movements: Extraocular movements intact.     Conjunctiva/sclera: Conjunctivae normal.     Pupils: Pupils are equal, round, and reactive to light.  Cardiovascular:     Rate and Rhythm: Normal rate and regular rhythm.     Heart sounds: No murmur heard.    No friction rub. No gallop.  Pulmonary:     Effort: Pulmonary effort is normal.     Breath sounds: Normal breath sounds.  Abdominal:     General: Abdomen is flat.     Palpations: Abdomen is soft.  Musculoskeletal:        General: Normal range of motion.  Skin:    General: Skin is warm and dry.     Comments: Left lower leg erythema extending to below the knee  Neurological:     General: No focal deficit present.     Mental Status: She is alert and oriented to person, place, and time.  Psychiatric:        Mood and Affect: Mood normal.     Lab Results Lab Results  Component Value Date   WBC 9.0 02/14/2022   HGB 8.9 (L) 02/14/2022   HCT 29.1 (L) 02/14/2022   MCV 81.3 02/14/2022   PLT 395 02/14/2022    Lab Results  Component Value Date   CREATININE 0.60 02/14/2022   BUN 13 02/14/2022   NA 142 02/14/2022   K 3.1 (L) 02/14/2022   CL 112 (H) 02/14/2022   CO2 22 02/14/2022    Lab Results  Component Value Date   ALT 20  02/12/2022   AST 19 02/12/2022   ALKPHOS 129 (H) 02/12/2022   BILITOT 0.4 02/12/2022       Danelle Earthly, MD [facility] 02/14/2022, 3:08 PM

## 2022-02-15 ENCOUNTER — Inpatient Hospital Stay (HOSPITAL_COMMUNITY): Payer: Medicare (Managed Care)

## 2022-02-15 DIAGNOSIS — R7881 Bacteremia: Secondary | ICD-10-CM | POA: Diagnosis not present

## 2022-02-15 DIAGNOSIS — B9562 Methicillin resistant Staphylococcus aureus infection as the cause of diseases classified elsewhere: Secondary | ICD-10-CM | POA: Diagnosis not present

## 2022-02-15 LAB — GLUCOSE, CAPILLARY
Glucose-Capillary: 152 mg/dL — ABNORMAL HIGH (ref 70–99)
Glucose-Capillary: 185 mg/dL — ABNORMAL HIGH (ref 70–99)
Glucose-Capillary: 217 mg/dL — ABNORMAL HIGH (ref 70–99)
Glucose-Capillary: 212 mg/dL — ABNORMAL HIGH (ref 70–99)

## 2022-02-15 LAB — CULTURE, BLOOD (ROUTINE X 2): Special Requests: ADEQUATE

## 2022-02-15 LAB — ECHOCARDIOGRAM COMPLETE
AR max vel: 2.16 cm2
AV Peak grad: 11.6 mmHg
Ao pk vel: 1.7 m/s
Area-P 1/2: 5.27 cm2
Calc EF: 65.9 %
S' Lateral: 2.3 cm
Single Plane A2C EF: 64.8 %
Single Plane A4C EF: 66.1 %

## 2022-02-15 NOTE — Progress Notes (Signed)
PROGRESS NOTE    April Cabrera  ZOX:[id number] DOB: 1970/11/23 DOA: 02/12/2022 PCP: Malka SoJobe, Daniel B., MD   Brief Narrative: 51 year old female with history of paraplegia and neurogenic bladder with suprapubic catheter chronic sacral decubitus ulcer spina bifid lives at home with her mother admitted with hematuria.  Patient had recent hospitalization for osteomyelitis of the pelvis.  She has a history significant for diabetes and hypertension.  Patient was also treated with daptomycin and cefepime in January 2023. Patient was brought to the ED by family due to gross hematuria.  Patient has a suprapubic catheter changed every 2 weeks.  There were no clots observed.  She had some suprapubic pain and tenderness.  No fever chills reported by the family. She received vancomycin and Rocephin in the ER. 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 Repeat blood cultures negative On vancomycin and cefepime Echocardiogram ordered Urine culture with multiple species. Leukocytosis improved to 9.0 from 15.1.  Recheck labs in a.m. Patient with history of osteomyelitis of the pelvis.  #2 hypokalemia potassium 3.1 replete.  Check mag level.  #3 chronic anemia no evidence of active bleeding.  Had some gross hematuria on admission.  Hemoglobin stable.  #4 stage IV sacral decubitus ulcer appreciate wound care follow-up.  On vancomycin and cefepime. MRI of the sacrum and pelvis-large chronic sacral decubitus ulcer with small amount of fluid along the left margin of the ulcer base.  No well-defined or rim-enhancing fluid collection.  Surgical absence  of the distal sacrum and coccyx no evidence of acute sacral osteomyelitis.  Congenital spina bifida.  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  #5 gross hematuria in the setting of neurogenic bladder and suprapubic catheter.  Urine culture with no growth.  Her catheter was changed past Monday, 02/08/2022.  #6 history of colostomy  #7 type 2 diabetes-CBG (last 3) on SSI Restart glipizide and Tradjenta Hold metformin Recent Labs    02/14/22 2141 02/15/22 0739 02/15/22 1136  GLUCAP 193* 152* 185*      Pressure Injury 02/13/22 Buttocks Right;Left;Upper;Mid;Lower Stage 4 - Full thickness tissue loss with exposed bone, tendon or muscle. (Active)  02/13/22 1741  Location: Buttocks  Location Orientation: Right;Left;Upper;Mid;Lower  Staging: Stage 4 - Full thickness tissue loss with exposed bone, tendon or muscle.  Wound Description (Comments):   Present on Admission: Yes  Dressing Type Alginate 02/15/22 0800     Pressure Injury 02/13/22 Knee Anterior;Left Stage 2 -  Partial thickness loss of dermis presenting as a shallow open injury with a red, pink wound bed without slough. (Active)  02/13/22 1742  Location: Knee  Location Orientation: Anterior;Left  Staging: Stage 2 -  Partial thickness loss of dermis presenting as a shallow open injury with a red, pink wound bed without slough.  Wound Description (Comments):   Present on Admission: Yes  Dressing Type Foam - Lift dressing to assess site every shift 02/15/22 0800     Estimated body mass index is 26.55 kg/m as calculated from the following:   Height as of this encounter: 5' 0.24" (1.53 m).   Weight as of  this encounter: 62.1 kg.  DVT prophylaxis: Lovenox  code Status: Full code  family Communication: Her brother-in-law was at bedside disposition Plan:  Status is: Inpatient Remains inpatient appropriate because: Bacteremia   Consultants:  Will consult ID  Procedures:  None Antimicrobials: Vancomycin and cefepime  Subjective:  Patient is resting in bed she is in no acute distress she denies any new complaints Suprapubic catheter was changed on 02/08/2022 with clear urine Objective: Vitals:   02/14/22 0437 02/14/22 1332 02/14/22 1951 02/15/22 0428  BP: 115/66 117/68 116/69 127/82  Pulse: (!) 107 100 (!) 101 96  Resp: Temp: 98.7 F (37.1 C) 99.1 F (37.3 C) 98.4 F (36.9 C) 98.3 F (36.8 C)  TempSrc: Oral Oral Oral Oral  SpO2: 96% 97% 98% 96%  Weight:      Height:        Intake/Output Summary (Last 24 hours) at 02/15/2022 1326 Last data filed at 02/15/2022 0428 Gross per 24 hour  Intake 1570.62 ml  Output 3275 ml  Net -1704.38 ml    Filed Weights   02/12/22 1743  Weight: 62.1 kg    Examination:  General exam: Appears calm and comfortable  Respiratory system: Clear to auscultation. Respiratory effort normal. Cardiovascular system: S1 & S2 heard, RRR. No JVD, murmurs, rubs, gallops or clicks. No pedal edema. Gastrointestinal system: Abdomen is nondistended, soft and nontender. No organomegaly or masses felt. Normal bowel sounds heard.  Suprapubic catheter in place left colostomy with liquid stool noted Central nervous system: Alert and oriented. No focal neurological deficits. Extremities: Contracted Skin: No rashes, lesions or ulcers Psychiatry: Judgement and insight appear normal. Mood & affect appropriate.     Data Reviewed: I have personally reviewed following labs and imaging studies  CBC: Recent Labs  Lab 02/12/22 1745 02/14/22 0345  WBC 15.1* 9.0  NEUTROABS 12.4*  --   HGB 10.6* 8.9*  HCT 33.2* 29.1*  MCV 78.7* 81.3  PLT 433* 395    Basic Metabolic Panel: Recent Labs  Lab 02/12/22 1745 02/14/22 0345  NA 134* 142  K 3.8 3.1*  CL 101 112*  CO2 22 22  GLUCOSE 189* 179*  BUN 14 13  CREATININE 0.43* 0.60  CALCIUM 9.2 8.2*    GFR: Estimated Creatinine Clearance: 70.4 mL/min (by C-G formula  based on SCr of 0.6 mg/dL). Liver Function Tests: Recent Labs  Lab 02/12/22 1745  AST 19  ALT 20  ALKPHOS 129*  BILITOT 0.4  PROT 8.2*  ALBUMIN 3.1*    No results for input(s): "LIPASE", "AMYLASE" in the last 168 hours. No results for input(s): "AMMONIA" in the last 168 hours. Coagulation Profile: Recent Labs  Lab 02/12/22 1803  INR 1.2    Cardiac Enzymes: No results for input(s): "CKTOTAL", "CKMB", "CKMBINDEX", "TROPONINI" in the last 168 hours. BNP (last 3 results) No results for input(s): "PROBNP" in the last 8760 hours. HbA1C: No results for input(s): "HGBA1C" in the last 72 hours. CBG: Recent Labs  Lab 02/14/22 1141 02/14/22 1706 02/14/22 2141 02/15/22 0739 02/15/22 1136  GLUCAP 242* 180* 193* 152* 185*    Lipid Profile: No results for input(s): "CHOL", "HDL", "LDLCALC", "TRIG", "CHOLHDL", "LDLDIRECT" in the last 72 hours. Thyroid Function Tests: No results for input(s): "TSH", "T4TOTAL", "FREET4", "T3FREE", "THYROIDAB" in the last 72 hours. Anemia Panel: No results for input(s): "VITAMINB12", "FOLATE", "FERRITIN", "TIBC", "IRON", "RETICCTPCT" in the last 72 hours. Sepsis Labs: Recent Labs  Lab 02/12/22 1745  LATICACIDVEN 1.5  Recent Results (from the past 240 hour(s))  Urine Culture     Status: Abnormal   Collection Time: 02/12/22  5:30 PM   Specimen: In/Out Cath Urine  Result Value Ref Range Status   Specimen Description   Final    IN/OUT CATH URINE Performed at [facility], [address] [place], [place] [address]    Special Requests   Final    NONE Performed at [facility], [address] [place], [place] [address]    Culture MULTIPLE SPECIES PRESENT, SUGGEST RECOLLECTION (A)  Final   Report Status 02/14/2022 FINAL  Final  Blood Culture (routine x 2)     Status: Abnormal   Collection Time: 02/12/22  5:58 PM   Specimen: BLOOD  Result Value Ref Range Status   Specimen Description   Final    BLOOD RIGHT  ANTECUBITAL Performed at [facility], [address] [place], [place] [address]    Special Requests   Final    Blood Culture adequate volume BOTTLES DRAWN AEROBIC AND ANAEROBIC Performed at [facility], [address] [place], [place] [address]    Culture  Setup Time   Final    GRAM POSITIVE COCCI IN CLUSTERS IN BOTH AEROBIC AND ANAEROBIC BOTTLES CRITICAL RESULT CALLED TO, READ BACK BY AND VERIFIED WITH: RN S.JOUGE AT 1315 ON 02/13/2022 BY T.SAAD. Performed at [facility], [address] [place], [place] [address]    Culture METHICILLIN RESISTANT STAPHYLOCOCCUS AUREUS (A)  Final   Report Status 02/15/2022 FINAL  Final   Organism ID, Bacteria METHICILLIN RESISTANT STAPHYLOCOCCUS AUREUS  Final      Susceptibility   Methicillin resistant staphylococcus aureus - MIC*    CIPROFLOXACIN >=8 RESISTANT Resistant     ERYTHROMYCIN >=8 RESISTANT Resistant     GENTAMICIN <=0.5 SENSITIVE Sensitive     OXACILLIN >=4 RESISTANT Resistant     TETRACYCLINE <=1 SENSITIVE Sensitive     VANCOMYCIN 1 SENSITIVE Sensitive     TRIMETH/SULFA <=10 SENSITIVE Sensitive     CLINDAMYCIN <=0.25 SENSITIVE Sensitive     RIFAMPIN <=0.5 SENSITIVE Sensitive     Inducible Clindamycin NEGATIVE Sensitive     * METHICILLIN RESISTANT STAPHYLOCOCCUS AUREUS  Blood Culture ID Panel (Reflexed)     Status: Abnormal   Collection Time: 02/12/22  5:58 PM  Result Value Ref Range Status   Enterococcus faecalis NOT DETECTED NOT DETECTED Final   Enterococcus Faecium NOT DETECTED NOT DETECTED Final   Listeria monocytogenes NOT DETECTED NOT DETECTED Final   Staphylococcus species DETECTED (A) NOT DETECTED Final    Comment: CRITICAL RESULT CALLED TO, READ BACK BY AND VERIFIED WITH: RN S.JOUGE AT 1315 ON 02/13/2022 BY T.SAAD.    Staphylococcus aureus (BCID) DETECTED (A) NOT DETECTED Final    Comment: Methicillin (oxacillin)-resistant Staphylococcus aureus (MRSA). MRSA is predictably resistant to  beta-lactam antibiotics (except ceftaroline). Preferred therapy is vancomycin unless clinically contraindicated. Patient requires contact precautions if  hospitalized. CRITICAL RESULT CALLED TO, READ BACK BY AND VERIFIED WITH: RN S.JOUGE AT 1315 ON 02/13/2022 BY T.SAAD.    Staphylococcus epidermidis NOT DETECTED NOT DETECTED Final   Staphylococcus lugdunensis NOT DETECTED NOT DETECTED Final   Streptococcus species NOT DETECTED NOT DETECTED Final   Streptococcus agalactiae NOT DETECTED NOT DETECTED Final   Streptococcus pneumoniae NOT DETECTED NOT DETECTED Final   Streptococcus pyogenes NOT DETECTED NOT DETECTED Final   A.calcoaceticus-baumannii NOT DETECTED NOT DETECTED Final   Bacteroides fragilis  NOT DETECTED NOT DETECTED Final   Enterobacterales NOT DETECTED NOT DETECTED Final   Enterobacter cloacae complex NOT DETECTED NOT DETECTED Final   Escherichia coli NOT DETECTED NOT DETECTED Final   Klebsiella aerogenes NOT DETECTED NOT DETECTED Final   Klebsiella oxytoca NOT DETECTED NOT DETECTED Final   Klebsiella pneumoniae NOT DETECTED NOT DETECTED Final   Proteus species NOT DETECTED NOT DETECTED Final   Salmonella species NOT DETECTED NOT DETECTED Final   Serratia marcescens NOT DETECTED NOT DETECTED Final   Haemophilus influenzae NOT DETECTED NOT DETECTED Final   Neisseria meningitidis NOT DETECTED NOT DETECTED Final   Pseudomonas aeruginosa NOT DETECTED NOT DETECTED Final   Stenotrophomonas maltophilia NOT DETECTED NOT DETECTED Final   Candida albicans NOT DETECTED NOT DETECTED Final   Candida auris NOT DETECTED NOT DETECTED Final   Candida glabrata NOT DETECTED NOT DETECTED Final   Candida krusei NOT DETECTED NOT DETECTED Final   Candida parapsilosis NOT DETECTED NOT DETECTED Final   Candida tropicalis NOT DETECTED NOT DETECTED Final   Cryptococcus neoformans/gattii NOT DETECTED NOT DETECTED Final   Meth resistant mecA/C and MREJ DETECTED (A) NOT DETECTED Final    Comment:  CRITICAL RESULT CALLED TO, READ BACK BY AND VERIFIED WITH: RN S.JOUGE AT 1315 ON 02/13/2022 BY T.SAAD. Performed at [facility], [address], [place], [place] [address]   Blood Culture (routine x 2)     Status: Abnormal   Collection Time: 02/12/22  6:10 PM   Specimen: BLOOD  Result Value Ref Range Status   Specimen Description   Final    BLOOD LEFT ANTECUBITAL Performed at [facility], [address], [place], [place] [address]    Special Requests   Final    Blood Culture results may not be optimal due to an inadequate volume of blood received in culture bottles BOTTLES DRAWN AEROBIC AND ANAEROBIC Performed at [facility], [address] [place], [place] [address]    Culture  Setup Time   Final    GRAM POSITIVE COCCI IN CLUSTERS AEROBIC BOTTLE ONLY CRITICAL VALUE NOTED.  VALUE IS CONSISTENT WITH PREVIOUSLY REPORTED AND CALLED VALUE.    Culture (A)  Final    STAPHYLOCOCCUS CAPITIS STAPHYLOCOCCUS EPIDERMIDIS THE SIGNIFICANCE OF ISOLATING THIS ORGANISM FROM A SINGLE SET OF BLOOD CULTURES WHEN MULTIPLE SETS ARE DRAWN IS UNCERTAIN. PLEASE NOTIFY THE MICROBIOLOGY DEPARTMENT WITHIN ONE WEEK IF SPECIATION AND SENSITIVITIES ARE REQUIRED. CRITICAL RESULT CALLED TO, READ BACK BY AND VERIFIED WITH: PHARMD NICK  G. [phone] FCP Performed at [facility], [address], [place], [place] [address]    Report Status 02/15/2022 FINAL  Final  Resp Panel by RT-PCR (Flu A&B, Covid)     Status: None   Collection Time: 02/12/22  6:19 PM   Specimen: Nasal Swab  Result Value Ref Range Status   SARS Coronavirus 2 by RT PCR NEGATIVE NEGATIVE Final    Comment: (NOTE) SARS-CoV-2 target nucleic acids are NOT DETECTED.  The SARS-CoV-2 RNA is generally detectable in upper respiratory specimens during the acute phase of infection. The lowest concentration of SARS-CoV-2 viral copies this assay can detect is 138 copies/mL. A negative result does not preclude  SARS-Cov-2 infection and should not be used as the sole basis for treatment or other patient management decisions. A negative result may occur with  improper specimen collection/handling, submission of specimen other than nasopharyngeal swab, presence of viral mutation(s) within the areas targeted by this assay, and  inadequate number of viral copies(<138 copies/mL). A negative result must be combined with clinical observations, patient history, and epidemiological information. The expected result is Negative.  Fact Sheet for Patients:  BloggerCourse.com  Fact Sheet for Healthcare Providers:  SeriousBroker.it  This test is no t yet approved or cleared by the [place] FDA and  has been authorized for detection and/or diagnosis of SARS-CoV-2 by FDA under an Emergency Use Authorization (EUA). This EUA will remain  in effect (meaning this test can be used) for the duration of the COVID-19 declaration under Section 564(b)(1) of the Act, 21 U.S.C.section 360bbb-3(b)(1), unless the authorization is terminated  or revoked sooner.       Influenza A by PCR NEGATIVE NEGATIVE Final   Influenza B by PCR NEGATIVE NEGATIVE Final    Comment: (NOTE) The Xpert Xpress SARS-CoV-2/FLU/RSV plus assay is intended as an aid in the diagnosis of influenza from Nasopharyngeal swab specimens and should not be used as a sole basis for treatment. Nasal washings and aspirates are unacceptable for Xpert Xpress SARS-CoV-2/FLU/RSV testing.  Fact Sheet for Patients: BloggerCourse.com  Fact Sheet for Healthcare Providers: SeriousBroker.it  This test is not yet approved or cleared by the [place] FDA and has been authorized for detection and/or diagnosis of SARS-CoV-2 by FDA under an Emergency Use Authorization (EUA). This EUA will remain in effect (meaning this test can be used) for the duration of  the COVID-19 declaration under Section 564(b)(1) of the Act, 21 U.S.C. section 360bbb-3(b)(1), unless the authorization is terminated or revoked.  Performed at [facility], [address] [place], [place] [address]   Culture, blood (Routine X 2) w Reflex to ID Panel     Status: None (Preliminary result)   Collection Time: 02/14/22  4:23 PM   Specimen: BLOOD  Result Value Ref Range Status   Specimen Description   Final    BLOOD BLOOD LEFT HAND Performed at [facility], [address] [place], [place] [address]    Special Requests   Final    BOTTLES DRAWN AEROBIC ONLY Blood Culture adequate volume Performed at [facility], [address] [place], [place] [address]    Culture   Final    NO GROWTH < 24 HOURS Performed at [facility], [address], [place], [place] [address]    Report Status PENDING  Incomplete  Culture, blood (Routine X 2) w Reflex to ID Panel     Status: None (Preliminary result)   Collection Time: 02/14/22  4:23 PM   Specimen: BLOOD  Result Value Ref Range Status   Specimen Description   Final    BLOOD BLOOD RIGHT HAND Performed at [facility], [address], [place], [place] [address]    Special Requests   Final    BOTTLES DRAWN AEROBIC ONLY Blood Culture adequate volume Performed at [facility], [address] [place], [place] [address]    Culture   Final    NO GROWTH < 24 HOURS Performed at [facility], [address], [place], [place] [address]    Report Status PENDING  Incomplete         Radiology Studies: MR PELVIS W WO CONTRAST  Result Date: 02/14/2022 CLINICAL DATA:  Sacral wound.  Osteomyelitis EXAM: MRI PELVIS WITHOUT AND WITH CONTRAST TECHNIQUE: Multiplanar multisequence MR imaging of the pelvis was performed both before and after administration of intravenous contrast. CONTRAST:  25mL GADAVIST GADOBUTROL 1  MMOL/ML IV SOLN  COMPARISON:  CT 08/19/2021 FINDINGS: Urinary Tract: Suprapubic catheter within the urinary bladder. No hydronephrosis. Bowel: Partially imaged bowel containing left lower abdominal wall hernia. No evidence of bowel obstruction or active bowel inflammation within the pelvis. Vascular/Lymphatic: Several prominent intrapelvic lymph nodes including 10 mm right external iliac chain node. No significant vascular abnormality evident by MRI. Reproductive:  No mass or other significant abnormality Other:  No free fluid within the pelvis. Musculoskeletal: Large chronic sacral decubitus ulcer. Mild postcontrast enhancement along the ulcer base. Small amount of fluid along the left margin of the ulcer base. No well-defined or rim enhancing fluid collection. There is truncation of the sacrum below the S3 level with absence of the coccyx, likely postsurgical. No bone marrow edema or enhancement to suggest acute osteomyelitis at this time. Changes of spina bifida within the lumbar spine. Bilateral hip dysplasia with moderate-advanced degenerative changes of both hips. No significant hip joint effusion. Right SI joint is partially fused. No SI joint effusion or diastasis. Diffuse fatty atrophy of the visualized musculature. IMPRESSION: 1. Large chronic sacral decubitus ulcer with small amount of fluid along the left margin of the ulcer base. No well-defined or rim enhancing fluid collection. 2. 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 No evidence of acute sacral osteomyelitis at this time. 3. Congenital spina bifida. 4. Bilateral hip dysplasia with moderate-advanced degenerative changes of both hips. Electronically Signed   By: Duanne Guess D.O.   On: 02/14/2022 09:18        Scheduled Meds:  darifenacin  7.5 mg Oral Daily   enoxaparin (LOVENOX) injection  40 mg Subcutaneous Q24H   Gerhardt's butt cream   Topical Daily   glipiZIDE  2.5 mg Oral Q breakfast   insulin aspart  0-15 Units Subcutaneous TID WC    iron polysaccharides  300 mg Oral Daily   linagliptin  5 mg Oral Daily   loratadine  10 mg Oral Daily   mirabegron ER  50 mg Oral Daily   mirabegron ER  50 mg Oral Once   pneumococcal 20-valent conjugate vaccine  0.5 mL Intramuscular Tomorrow-1000   Continuous Infusions:  ceFEPime (MAXIPIME) IV 2 g (02/15/22 1227)   lactated ringers 100 mL/hr at 02/15/22 1229   vancomycin 1,250 mg (02/14/22 2013)     LOS: 2 days    Time spent: 38 min  Alwyn Ren, MD 02/15/2022, 1:26 PM

## 2022-02-15 NOTE — Progress Notes (Signed)
Date of Admission:  02/12/2022   Total days of antibiotics: 2 vanco/cefepime   Assessment: MRSA bacteremia Spina bifida Sacral wounds/decubitus DM2  Plan: Continue vancomycin Continue cefepime due to prior pseudomonas Repeat BCx sent 7-16 TTE ordered.   Thank you so much for this interesting consult,  Principal Problem:   MRSA bacteremia Active Problems:   Sepsis (HCC)   Suprapubic catheter (HCC)   Essential hypertension   Morbid obesity with BMI of 45.0-49.9, adult (HCC)   Type 2 diabetes mellitus with hyperglycemia, without long-term current use of insulin (HCC)   Cellulitis of left lower extremity   Sacral decubitus ulcer   Colostomy in place (HCC)    darifenacin  7.5 mg Oral Daily   enoxaparin (LOVENOX) injection  40 mg Subcutaneous Q24H   Gerhardt's butt cream   Topical Daily   glipiZIDE  2.5 mg Oral Q breakfast   insulin aspart  0-15 Units Subcutaneous TID WC   iron polysaccharides  300 mg Oral Daily   linagliptin  5 mg Oral Daily   loratadine  10 mg Oral Daily   mirabegron ER  50 mg Oral Daily   mirabegron ER  50 mg Oral Once   pneumococcal 20-valent conjugate vaccine  0.5 mL Intramuscular Tomorrow-1000    Subj- No complaints.  Wound care has been in, in the AM.   Review of Systems: Review of Systems  Constitutional:  Negative for chills and fever.  Gastrointestinal:  Negative for constipation and diarrhea.  Genitourinary:  Negative for dysuria.    Past Medical History:  Diagnosis Date   DMII (diabetes mellitus, type 2) (HCC)    Paraplegia (HCC)    Spina bifida (HCC)     Social History   Tobacco Use   Smoking status: Never   Smokeless tobacco: Never  Substance Use Topics   Alcohol use: Never   Drug use: Never    History reviewed. No pertinent family history.   Medications: Scheduled:  darifenacin  7.5 mg Oral Daily   enoxaparin (LOVENOX) injection  40 mg Subcutaneous Q24H   Gerhardt's butt cream   Topical Daily   glipiZIDE   2.5 mg Oral Q breakfast   insulin aspart  0-15 Units Subcutaneous TID WC   iron polysaccharides  300 mg Oral Daily   linagliptin  5 mg Oral Daily   loratadine  10 mg Oral Daily   mirabegron ER  50 mg Oral Daily   mirabegron ER  50 mg Oral Once   pneumococcal 20-valent conjugate vaccine  0.5 mL Intramuscular Tomorrow-1000    Abtx:  Anti-infectives (From admission, onward)    Start     Dose/Rate Route Frequency Ordered Stop   02/13/22 2100  Vancomycin (VANCOCIN) 1,250 mg in sodium chloride 0.9 % 250 mL IVPB  Status:  Discontinued        1,250 mg 166.7 mL/hr over 90 Minutes Intravenous Every 24 hours 02/13/22 1453 02/13/22 1717   02/13/22 2000  vancomycin (VANCOREADY) IVPB 1250 mg/250 mL        1,250 mg 166.7 mL/hr over 90 Minutes Intravenous Every 24 hours 02/13/22 1722     02/13/22 1830  ceFEPIme (MAXIPIME) 2 g in sodium chloride 0.9 % 100 mL IVPB        2 g 200 mL/hr over 30 Minutes Intravenous Every 8 hours 02/13/22 1714     02/12/22 2100  vancomycin (VANCOCIN) IVPB 1000 mg/200 mL premix        1,000 mg 200 mL/hr  over 60 Minutes Intravenous  Once 02/12/22 2053 02/12/22 2300   02/12/22 1745  cefTRIAXone (ROCEPHIN) 2 g in sodium chloride 0.9 % 100 mL IVPB  Status:  Discontinued        2 g 200 mL/hr over 30 Minutes Intravenous Every 24 hours 02/12/22 1739 02/13/22 1655         OBJECTIVE: Blood pressure 122/71, pulse 100, temperature 97.9 F (36.6 C), temperature source Oral, resp. rate 16, height 5' 0.24" (1.53 m), weight 62.1 kg, SpO2 96 %.  Physical Exam Constitutional:      Appearance: She is obese.  Cardiovascular:     Rate and Rhythm: Normal rate and regular rhythm.  Pulmonary:     Effort: Pulmonary effort is normal.     Breath sounds: Normal breath sounds.  Abdominal:     General: Bowel sounds are normal. There is no distension.     Palpations: Abdomen is soft.     Tenderness: There is no abdominal tenderness.  Musculoskeletal:     Right lower leg: Edema  present.     Left lower leg: Edema present.  Neurological:     Mental Status: She is alert.   Dressed wounds on dorsum of feet.   Lab Results Results for orders placed or performed during the hospital encounter of 02/12/22 (from the past 48 hour(s))  Glucose, capillary     Status: Abnormal   Collection Time: 02/13/22  7:29 PM  Result Value Ref Range   Glucose-Capillary 207 (H) 70 - 99 mg/dL    Comment: Glucose reference range applies only to samples taken after fasting for at least 8 hours.  Glucose, capillary     Status: Abnormal   Collection Time: 02/13/22 10:03 PM  Result Value Ref Range   Glucose-Capillary 230 (H) 70 - 99 mg/dL    Comment: Glucose reference range applies only to samples taken after fasting for at least 8 hours.  HIV Antibody (routine testing w rflx)     Status: None   Collection Time: 02/14/22  3:45 AM  Result Value Ref Range   HIV Screen 4th Generation wRfx Non Reactive Non Reactive    Comment: Performed at [facility], [address] [place], [place] [address]  Basic metabolic panel     Status: Abnormal   Collection Time: 02/14/22  3:45 AM  Result Value Ref Range   Sodium 142 135 - 145 mmol/L    Comment: DELTA CHECK NOTED   Potassium 3.1 (L) 3.5 - 5.1 mmol/L   Chloride 112 (H) 98 - 111 mmol/L   CO2 22 22 - 32 mmol/L   Glucose, Bld 179 (H) 70 - 99 mg/dL    Comment: Glucose reference range applies only to samples taken after fasting for at least 8 hours.   BUN 13 6 - 20 mg/dL   Creatinine, Ser 4.40 0.44 - 1.00 mg/dL   Calcium 8.2 (L) 8.9 - 10.3 mg/dL   GFR, Estimated >34 >74 mL/min    Comment: (NOTE) Calculated using the CKD-EPI Creatinine Equation (2021)    Anion gap 8 5 - 15    Comment: Performed at [facility], [address], [place], [place] [address]  CBC     Status: Abnormal   Collection Time: 02/14/22  3:45 AM  Result Value Ref Range   WBC 9.0 4.0 - 10.5 K/uL   RBC 3.58 (L) 3.87 - 5.11 MIL/uL   Hemoglobin 8.9 (L)  12.0 - 15.0 g/dL   HCT 38.7 (L)  36.0 - 46.0 %   MCV 81.3 80.0 - 100.0 fL   MCH 24.9 (L) 26.0 - 34.0 pg   MCHC 30.6 30.0 - 36.0 g/dL   RDW 16.1 (H) 09.6 - 04.5 %   Platelets 395 150 - 400 K/uL   nRBC 0.0 0.0 - 0.2 %    Comment: Performed at [facility], [address], [place], [place] [address]  Glucose, capillary     Status: Abnormal   Collection Time: 02/14/22  8:11 AM  Result Value Ref Range   Glucose-Capillary 203 (H) 70 - 99 mg/dL    Comment: Glucose reference range applies only to samples taken after fasting for at least 8 hours.  Glucose, capillary     Status: Abnormal   Collection Time: 02/14/22 11:41 AM  Result Value Ref Range   Glucose-Capillary 242 (H) 70 - 99 mg/dL    Comment: Glucose reference range applies only to samples taken after fasting for at least 8 hours.  Culture, blood (Routine X 2) w Reflex to ID Panel     Status: None (Preliminary result)   Collection Time: 02/14/22  4:23 PM   Specimen: BLOOD  Result Value Ref Range   Specimen Description      BLOOD BLOOD LEFT HAND Performed at [facility], [address], [place], [place] [address]    Special Requests      BOTTLES DRAWN AEROBIC ONLY Blood Culture adequate volume Performed at [facility], [address], [place], [place] [address]    Culture      NO GROWTH < 24 HOURS Performed at [facility], [address] [place], [place] [address]    Report Status PENDING   Culture, blood (Routine X 2) w Reflex to ID Panel     Status: None (Preliminary result)   Collection Time: 02/14/22  4:23 PM   Specimen: BLOOD  Result Value Ref Range   Specimen Description      BLOOD BLOOD RIGHT HAND Performed at [facility], [address] [place], [place] [address]    Special Requests      BOTTLES DRAWN AEROBIC ONLY Blood Culture adequate volume Performed at [facility], [address] [place], [place]  [address]    Culture      NO GROWTH < 24 HOURS Performed at [facility], [address] [place], [place] [address]    Report Status PENDING   Glucose, capillary     Status: Abnormal   Collection Time: 02/14/22  5:06 PM  Result Value Ref Range   Glucose-Capillary 180 (H) 70 - 99 mg/dL    Comment: Glucose reference range applies only to samples taken after fasting for at least 8 hours.  Glucose, capillary     Status: Abnormal   Collection Time: 02/14/22  9:41 PM  Result Value Ref Range   Glucose-Capillary 193 (H) 70 - 99 mg/dL    Comment: Glucose reference range applies only to samples taken after fasting for at least 8 hours.  Glucose, capillary     Status: Abnormal   Collection Time: 02/15/22  7:39 AM  Result Value Ref Range   Glucose-Capillary 152 (H) 70 - 99 mg/dL    Comment: Glucose reference range applies only to samples taken after fasting for at least 8 hours.  Glucose, capillary     Status: Abnormal   Collection Time: 02/15/22 11:36 AM  Result Value Ref Range   Glucose-Capillary 185 (  H) 70 - 99 mg/dL    Comment: Glucose reference range applies only to samples taken after fasting for at least 8 hours.      Component Value Date/Time   SDES  02/14/2022 1623    BLOOD BLOOD LEFT HAND Performed at [facility], [address] [place], [place] [address]    SDES  02/14/2022 1623    BLOOD BLOOD RIGHT HAND Performed at [facility], [address], [place], [place] [address]    SPECREQUEST  02/14/2022 1623    BOTTLES DRAWN AEROBIC ONLY Blood Culture adequate volume Performed at [facility], [address], [place], [place] [address]    SPECREQUEST  02/14/2022 1623    BOTTLES DRAWN AEROBIC ONLY Blood Culture adequate volume Performed at [facility], [address], [place], [place] [address]    CULT  02/14/2022 1623    NO GROWTH < 24 HOURS Performed at [facility], [address] [place], [place] [address]    CULT  02/14/2022 1623    NO GROWTH < 24 HOURS Performed at [facility], [address], [place], [place] [address]    REPTSTATUS PENDING 02/14/2022 1623   REPTSTATUS PENDING 02/14/2022 1623   MR PELVIS W WO CONTRAST  Result Date: 02/14/2022 CLINICAL DATA:  Sacral wound.  Osteomyelitis EXAM: MRI PELVIS WITHOUT AND WITH CONTRAST TECHNIQUE: Multiplanar multisequence MR imaging of the pelvis was performed both before and after administration of intravenous contrast. CONTRAST:  6mL GADAVIST GADOBUTROL 1 MMOL/ML IV SOLN COMPARISON:  CT 08/19/2021 FINDINGS: Urinary Tract: Suprapubic catheter within the urinary bladder. No hydronephrosis. Bowel: Partially imaged bowel containing left lower abdominal wall hernia. No evidence of bowel obstruction or active bowel inflammation within the pelvis. Vascular/Lymphatic: Several prominent intrapelvic lymph nodes including 10 mm right external iliac chain node. No significant vascular abnormality evident by MRI. Reproductive:  No mass or other significant abnormality Other:  No free fluid within the pelvis. Musculoskeletal: Large chronic sacral decubitus ulcer. Mild postcontrast enhancement along the ulcer base. Small amount of fluid along the left margin of the ulcer base. No well-defined or rim enhancing fluid collection. There is truncation of the sacrum below the S3 level with absence of the coccyx, likely postsurgical. No bone marrow edema or enhancement to suggest acute osteomyelitis at this time. Changes of spina bifida within the lumbar spine. Bilateral hip dysplasia with moderate-advanced degenerative changes of both hips. No significant hip joint effusion. Right SI joint is partially fused. No SI joint effusion or diastasis. Diffuse fatty atrophy of the visualized musculature. IMPRESSION: 1. Large chronic sacral decubitus ulcer with small amount of fluid along the left margin of the ulcer base. No well-defined or rim enhancing  fluid collection. 2. 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 No evidence of acute sacral osteomyelitis at this time. 3. Congenital spina bifida. 4. Bilateral hip dysplasia with moderate-advanced degenerative changes of both hips. Electronically Signed   By: Duanne Guess D.O.   On: 02/14/2022 09:18   Recent Results (from the past 240 hour(s))  Urine Culture     Status: Abnormal   Collection Time: 02/12/22  5:30 PM   Specimen: In/Out Cath Urine  Result Value Ref Range Status   Specimen Description   Final    IN/OUT CATH URINE Performed at [facility], [address] [place], [place] [address]    Special Requests   Final    NONE Performed  at [facility], [address] [place], [place] [address]    Culture MULTIPLE SPECIES PRESENT, SUGGEST RECOLLECTION (A)  Final   Report Status 02/14/2022 FINAL  Final  Blood Culture (routine x 2)     Status: Abnormal   Collection Time: 02/12/22  5:58 PM   Specimen: BLOOD  Result Value Ref Range Status   Specimen Description   Final    BLOOD RIGHT ANTECUBITAL Performed at [facility], [address] [place], [place] [address]    Special Requests   Final    Blood Culture adequate volume BOTTLES DRAWN AEROBIC AND ANAEROBIC Performed at [facility], [address] [place], [place] [address]    Culture  Setup Time   Final    GRAM POSITIVE COCCI IN CLUSTERS IN BOTH AEROBIC AND ANAEROBIC BOTTLES CRITICAL RESULT CALLED TO, READ BACK BY AND VERIFIED WITH: RN S.JOUGE AT 1315 ON 02/13/2022 BY T.SAAD. Performed at [facility], [address], [place], [place] [address]    Culture METHICILLIN RESISTANT STAPHYLOCOCCUS AUREUS (A)  Final   Report Status 02/15/2022 FINAL  Final   Organism ID, Bacteria METHICILLIN RESISTANT STAPHYLOCOCCUS AUREUS  Final      Susceptibility   Methicillin resistant staphylococcus aureus - MIC*    CIPROFLOXACIN >=8 RESISTANT Resistant     ERYTHROMYCIN >=8 RESISTANT  Resistant     GENTAMICIN <=0.5 SENSITIVE Sensitive     OXACILLIN >=4 RESISTANT Resistant     TETRACYCLINE <=1 SENSITIVE Sensitive     VANCOMYCIN 1 SENSITIVE Sensitive     TRIMETH/SULFA <=10 SENSITIVE Sensitive     CLINDAMYCIN <=0.25 SENSITIVE Sensitive     RIFAMPIN <=0.5 SENSITIVE Sensitive     Inducible Clindamycin NEGATIVE Sensitive     * METHICILLIN RESISTANT STAPHYLOCOCCUS AUREUS  Blood Culture ID Panel (Reflexed)     Status: Abnormal   Collection Time: 02/12/22  5:58 PM  Result Value Ref Range Status   Enterococcus faecalis NOT DETECTED NOT DETECTED Final   Enterococcus Faecium NOT DETECTED NOT DETECTED Final   Listeria monocytogenes NOT DETECTED NOT DETECTED Final   Staphylococcus species DETECTED (A) NOT DETECTED Final    Comment: CRITICAL RESULT CALLED TO, READ BACK BY AND VERIFIED WITH: RN S.JOUGE AT 1315 ON 02/13/2022 BY T.SAAD.    Staphylococcus aureus (BCID) DETECTED (A) NOT DETECTED Final    Comment: Methicillin (oxacillin)-resistant Staphylococcus aureus (MRSA). MRSA is predictably resistant to beta-lactam antibiotics (except ceftaroline). Preferred therapy is vancomycin unless clinically contraindicated. Patient requires contact precautions if  hospitalized. CRITICAL RESULT CALLED TO, READ BACK BY AND VERIFIED WITH: RN S.JOUGE AT 1315 ON 02/13/2022 BY T.SAAD.    Staphylococcus epidermidis NOT DETECTED NOT DETECTED Final   Staphylococcus lugdunensis NOT DETECTED NOT DETECTED Final   Streptococcus species NOT DETECTED NOT DETECTED Final   Streptococcus agalactiae NOT DETECTED NOT DETECTED Final   Streptococcus pneumoniae NOT DETECTED NOT DETECTED Final   Streptococcus pyogenes NOT DETECTED NOT DETECTED Final   A.calcoaceticus-baumannii NOT DETECTED NOT DETECTED Final   Bacteroides fragilis NOT DETECTED NOT DETECTED Final   Enterobacterales NOT DETECTED NOT DETECTED Final   Enterobacter cloacae complex NOT DETECTED NOT DETECTED Final   Escherichia coli NOT DETECTED NOT  DETECTED Final   Klebsiella aerogenes NOT DETECTED NOT DETECTED Final   Klebsiella oxytoca NOT DETECTED NOT DETECTED Final   Klebsiella pneumoniae NOT DETECTED NOT DETECTED Final   Proteus species NOT DETECTED NOT DETECTED Final   Salmonella species NOT DETECTED NOT DETECTED  Final   Serratia marcescens NOT DETECTED NOT DETECTED Final   Haemophilus influenzae NOT DETECTED NOT DETECTED Final   Neisseria meningitidis NOT DETECTED NOT DETECTED Final   Pseudomonas aeruginosa NOT DETECTED NOT DETECTED Final   Stenotrophomonas maltophilia NOT DETECTED NOT DETECTED Final   Candida albicans NOT DETECTED NOT DETECTED Final   Candida auris NOT DETECTED NOT DETECTED Final   Candida glabrata NOT DETECTED NOT DETECTED Final   Candida krusei NOT DETECTED NOT DETECTED Final   Candida parapsilosis NOT DETECTED NOT DETECTED Final   Candida tropicalis NOT DETECTED NOT DETECTED Final   Cryptococcus neoformans/gattii NOT DETECTED NOT DETECTED Final   Meth resistant mecA/C and MREJ DETECTED (A) NOT DETECTED Final    Comment: CRITICAL RESULT CALLED TO, READ BACK BY AND VERIFIED WITH: RN S.JOUGE AT 1315 ON 02/13/2022 BY T.SAAD. Performed at [facility], [address], [place], [place] [address]   Blood Culture (routine x 2)     Status: Abnormal   Collection Time: 02/12/22  6:10 PM   Specimen: BLOOD  Result Value Ref Range Status   Specimen Description   Final    BLOOD LEFT ANTECUBITAL Performed at [facility], [address], [place], [place] [address]    Special Requests   Final    Blood Culture results may not be optimal due to an inadequate volume of blood received in culture bottles BOTTLES DRAWN AEROBIC AND ANAEROBIC Performed at [facility], [address] [place], [place] [address]    Culture  Setup Time   Final    GRAM POSITIVE COCCI IN CLUSTERS AEROBIC BOTTLE ONLY CRITICAL VALUE NOTED.  VALUE IS CONSISTENT WITH PREVIOUSLY REPORTED AND CALLED VALUE.    Culture (A)   Final    STAPHYLOCOCCUS CAPITIS STAPHYLOCOCCUS EPIDERMIDIS THE SIGNIFICANCE OF ISOLATING THIS ORGANISM FROM A SINGLE SET OF BLOOD CULTURES WHEN MULTIPLE SETS ARE DRAWN IS UNCERTAIN. PLEASE NOTIFY THE MICROBIOLOGY DEPARTMENT WITHIN ONE WEEK IF SPECIATION AND SENSITIVITIES ARE REQUIRED. CRITICAL RESULT CALLED TO, READ BACK BY AND VERIFIED WITH: PHARMD NICK  G. [phone] FCP Performed at [facility], [address] [place], [place] [address]    Report Status 02/15/2022 FINAL  Final  Resp Panel by RT-PCR (Flu A&B, Covid)     Status: None   Collection Time: 02/12/22  6:19 PM   Specimen: Nasal Swab  Result Value Ref Range Status   SARS Coronavirus 2 by RT PCR NEGATIVE NEGATIVE Final    Comment: (NOTE) SARS-CoV-2 target nucleic acids are NOT DETECTED.  The SARS-CoV-2 RNA is generally detectable in upper respiratory specimens during the acute phase of infection. The lowest concentration of SARS-CoV-2 viral copies this assay can detect is 138 copies/mL. A negative result does not preclude SARS-Cov-2 infection and should not be used as the sole basis for treatment or other patient management decisions. A negative result may occur with  improper specimen collection/handling, submission of specimen other than nasopharyngeal swab, presence of viral mutation(s) within the areas targeted by this assay, and inadequate number of viral copies(<138 copies/mL). A negative result must be combined with clinical observations, patient history, and epidemiological information. The expected result is Negative.  Fact Sheet for Patients:  BloggerCourse.com  Fact Sheet for Healthcare Providers:  SeriousBroker.it  This test is no t yet approved or cleared by the [place] FDA and  has been authorized for detection and/or diagnosis of SARS-CoV-2 by FDA under an Emergency Use Authorization (EUA). This EUA will  remain  in effect (meaning this test  can be used) for the duration of the COVID-19 declaration under Section 564(b)(1) of the Act, 21 U.S.C.section 360bbb-3(b)(1), unless the authorization is terminated  or revoked sooner.       Influenza A by PCR NEGATIVE NEGATIVE Final   Influenza B by PCR NEGATIVE NEGATIVE Final    Comment: (NOTE) The Xpert Xpress SARS-CoV-2/FLU/RSV plus assay is intended as an aid in the diagnosis of influenza from Nasopharyngeal swab specimens and should not be used as a sole basis for treatment. Nasal washings and aspirates are unacceptable for Xpert Xpress SARS-CoV-2/FLU/RSV testing.  Fact Sheet for Patients: BloggerCourse.com  Fact Sheet for Healthcare Providers: SeriousBroker.it  This test is not yet approved or cleared by the [place] FDA and has been authorized for detection and/or diagnosis of SARS-CoV-2 by FDA under an Emergency Use Authorization (EUA). This EUA will remain in effect (meaning this test can be used) for the duration of the COVID-19 declaration under Section 564(b)(1) of the Act, 21 U.S.C. section 360bbb-3(b)(1), unless the authorization is terminated or revoked.  Performed at [facility], [address] [place], [place] [address]   Culture, blood (Routine X 2) w Reflex to ID Panel     Status: None (Preliminary result)   Collection Time: 02/14/22  4:23 PM   Specimen: BLOOD  Result Value Ref Range Status   Specimen Description   Final    BLOOD BLOOD LEFT HAND Performed at [facility], [address], [place], [place] [address]    Special Requests   Final    BOTTLES DRAWN AEROBIC ONLY Blood Culture adequate volume Performed at [facility], [address], [place], [place] [address]    Culture   Final    NO GROWTH < 24 HOURS Performed at [facility], [address], [place], [place] [address]    Report Status PENDING  Incomplete  Culture, blood  (Routine X 2) w Reflex to ID Panel     Status: None (Preliminary result)   Collection Time: 02/14/22  4:23 PM   Specimen: BLOOD  Result Value Ref Range Status   Specimen Description   Final    BLOOD BLOOD RIGHT HAND Performed at [facility], [address], [place], [place] [address]    Special Requests   Final    BOTTLES DRAWN AEROBIC ONLY Blood Culture adequate volume Performed at [facility], [address] [place], [place] [address]    Culture   Final    NO GROWTH < 24 HOURS Performed at [facility], [address] [place], [place] [address]    Report Status PENDING  Incomplete    Microbiology: Recent Results (from the past 240 hour(s))  Urine Culture     Status: Abnormal   Collection Time: 02/12/22  5:30 PM   Specimen: In/Out Cath Urine  Result Value Ref Range Status   Specimen Description   Final    IN/OUT CATH URINE Performed at [facility], [address] [place], [place] [address]    Special Requests   Final    NONE Performed at [facility], [address] [place], [place] [address]    Culture MULTIPLE SPECIES PRESENT, SUGGEST RECOLLECTION (A)  Final   Report Status 02/14/2022 FINAL  Final  Blood Culture (routine x 2)     Status: Abnormal   Collection Time: 02/12/22  5:58 PM  Specimen: BLOOD  Result Value Ref Range Status   Specimen Description   Final    BLOOD RIGHT ANTECUBITAL Performed at [facility], [address] [place], [place] [address]    Special Requests   Final    Blood Culture adequate volume BOTTLES DRAWN AEROBIC AND ANAEROBIC Performed at [facility], [address] [place], [place] [address]    Culture  Setup Time   Final    GRAM POSITIVE COCCI IN CLUSTERS IN BOTH AEROBIC AND ANAEROBIC BOTTLES CRITICAL RESULT CALLED TO, READ BACK BY AND VERIFIED WITH: RN S.JOUGE AT 1315 ON 02/13/2022 BY T.SAAD. Performed at [facility], [address] [place], [place] [address]    Culture METHICILLIN RESISTANT STAPHYLOCOCCUS AUREUS (A)  Final   Report Status 02/15/2022 FINAL  Final   Organism ID, Bacteria METHICILLIN RESISTANT STAPHYLOCOCCUS AUREUS  Final      Susceptibility   Methicillin resistant staphylococcus aureus - MIC*    CIPROFLOXACIN >=8 RESISTANT Resistant     ERYTHROMYCIN >=8 RESISTANT Resistant     GENTAMICIN <=0.5 SENSITIVE Sensitive     OXACILLIN >=4 RESISTANT Resistant     TETRACYCLINE <=1 SENSITIVE Sensitive     VANCOMYCIN 1 SENSITIVE Sensitive     TRIMETH/SULFA <=10 SENSITIVE Sensitive     CLINDAMYCIN <=0.25 SENSITIVE Sensitive     RIFAMPIN <=0.5 SENSITIVE Sensitive     Inducible Clindamycin NEGATIVE Sensitive     * METHICILLIN RESISTANT STAPHYLOCOCCUS AUREUS  Blood Culture ID Panel (Reflexed)     Status: Abnormal   Collection Time: 02/12/22  5:58 PM  Result Value Ref Range Status   Enterococcus faecalis NOT DETECTED NOT DETECTED Final   Enterococcus Faecium NOT DETECTED NOT DETECTED Final   Listeria monocytogenes NOT DETECTED NOT DETECTED Final   Staphylococcus species DETECTED (A) NOT DETECTED Final    Comment: CRITICAL RESULT CALLED TO, READ BACK BY AND VERIFIED WITH: RN S.JOUGE AT 1315 ON 02/13/2022 BY T.SAAD.    Staphylococcus aureus (BCID) DETECTED (A) NOT DETECTED Final    Comment: Methicillin (oxacillin)-resistant Staphylococcus aureus (MRSA). MRSA is predictably resistant to beta-lactam antibiotics (except ceftaroline). Preferred therapy is vancomycin unless clinically contraindicated. Patient requires contact precautions if  hospitalized. CRITICAL RESULT CALLED TO, READ BACK BY AND VERIFIED WITH: RN S.JOUGE AT 1315 ON 02/13/2022 BY T.SAAD.    Staphylococcus epidermidis NOT DETECTED NOT DETECTED Final   Staphylococcus lugdunensis NOT DETECTED NOT DETECTED Final   Streptococcus species NOT DETECTED NOT DETECTED Final   Streptococcus agalactiae NOT DETECTED NOT DETECTED Final   Streptococcus pneumoniae  NOT DETECTED NOT DETECTED Final   Streptococcus pyogenes NOT DETECTED NOT DETECTED Final   A.calcoaceticus-baumannii NOT DETECTED NOT DETECTED Final   Bacteroides fragilis NOT DETECTED NOT DETECTED Final   Enterobacterales NOT DETECTED NOT DETECTED Final   Enterobacter cloacae complex NOT DETECTED NOT DETECTED Final   Escherichia coli NOT DETECTED NOT DETECTED Final   Klebsiella aerogenes NOT DETECTED NOT DETECTED Final   Klebsiella oxytoca NOT DETECTED NOT DETECTED Final   Klebsiella pneumoniae NOT DETECTED NOT DETECTED Final   Proteus species NOT DETECTED NOT DETECTED Final   Salmonella species NOT DETECTED NOT DETECTED Final   Serratia marcescens NOT DETECTED NOT DETECTED Final   Haemophilus influenzae NOT DETECTED NOT DETECTED Final   Neisseria meningitidis NOT DETECTED NOT DETECTED Final   Pseudomonas aeruginosa NOT DETECTED NOT DETECTED Final   Stenotrophomonas maltophilia NOT DETECTED NOT DETECTED Final   Candida albicans NOT DETECTED NOT DETECTED Final  Candida auris NOT DETECTED NOT DETECTED Final   Candida glabrata NOT DETECTED NOT DETECTED Final   Candida krusei NOT DETECTED NOT DETECTED Final   Candida parapsilosis NOT DETECTED NOT DETECTED Final   Candida tropicalis NOT DETECTED NOT DETECTED Final   Cryptococcus neoformans/gattii NOT DETECTED NOT DETECTED Final   Meth resistant mecA/C and MREJ DETECTED (A) NOT DETECTED Final    Comment: CRITICAL RESULT CALLED TO, READ BACK BY AND VERIFIED WITH: RN S.JOUGE AT 1315 ON 02/13/2022 BY T.SAAD. Performed at [facility], [address] [place], [place] [address]   Blood Culture (routine x 2)     Status: Abnormal   Collection Time: 02/12/22  6:10 PM   Specimen: BLOOD  Result Value Ref Range Status   Specimen Description   Final    BLOOD LEFT ANTECUBITAL Performed at [facility], [address] [place], [place] [address]    Special Requests   Final    Blood Culture results may not be optimal due to an  inadequate volume of blood received in culture bottles BOTTLES DRAWN AEROBIC AND ANAEROBIC Performed at [facility], [address] [place], [place] [address]    Culture  Setup Time   Final    GRAM POSITIVE COCCI IN CLUSTERS AEROBIC BOTTLE ONLY CRITICAL VALUE NOTED.  VALUE IS CONSISTENT WITH PREVIOUSLY REPORTED AND CALLED VALUE.    Culture (A)  Final    STAPHYLOCOCCUS CAPITIS STAPHYLOCOCCUS EPIDERMIDIS THE SIGNIFICANCE OF ISOLATING THIS ORGANISM FROM A SINGLE SET OF BLOOD CULTURES WHEN MULTIPLE SETS ARE DRAWN IS UNCERTAIN. PLEASE NOTIFY THE MICROBIOLOGY DEPARTMENT WITHIN ONE WEEK IF SPECIATION AND SENSITIVITIES ARE REQUIRED. CRITICAL RESULT CALLED TO, READ BACK BY AND VERIFIED WITH: PHARMD NICK  G. [phone] FCP Performed at [facility], [address] [place], [place] [address]    Report Status 02/15/2022 FINAL  Final  Resp Panel by RT-PCR (Flu A&B, Covid)     Status: None   Collection Time: 02/12/22  6:19 PM   Specimen: Nasal Swab  Result Value Ref Range Status   SARS Coronavirus 2 by RT PCR NEGATIVE NEGATIVE Final    Comment: (NOTE) SARS-CoV-2 target nucleic acids are NOT DETECTED.  The SARS-CoV-2 RNA is generally detectable in upper respiratory specimens during the acute phase of infection. The lowest concentration of SARS-CoV-2 viral copies this assay can detect is 138 copies/mL. A negative result does not preclude SARS-Cov-2 infection and should not be used as the sole basis for treatment or other patient management decisions. A negative result may occur with  improper specimen collection/handling, submission of specimen other than nasopharyngeal swab, presence of viral mutation(s) within the areas targeted by this assay, and inadequate number of viral copies(<138 copies/mL). A negative result must be combined with clinical observations, patient history, and epidemiological information. The expected result is Negative.  Fact Sheet for Patients:   BloggerCourse.com  Fact Sheet for Healthcare Providers:  SeriousBroker.it  This test is no t yet approved or cleared by the [place] FDA and  has been authorized for detection and/or diagnosis of SARS-CoV-2 by FDA under an Emergency Use Authorization (EUA). This EUA will remain  in effect (meaning this test can be used) for the duration of the COVID-19 declaration under Section 564(b)(1) of the Act, 21 U.S.C.section 360bbb-3(b)(1), unless the authorization is terminated  or revoked sooner.       Influenza A by PCR NEGATIVE NEGATIVE Final   Influenza B by PCR NEGATIVE NEGATIVE  Final    Comment: (NOTE) The Xpert Xpress SARS-CoV-2/FLU/RSV plus assay is intended as an aid in the diagnosis of influenza from Nasopharyngeal swab specimens and should not be used as a sole basis for treatment. Nasal washings and aspirates are unacceptable for Xpert Xpress SARS-CoV-2/FLU/RSV testing.  Fact Sheet for Patients: BloggerCourse.com  Fact Sheet for Healthcare Providers: SeriousBroker.it  This test is not yet approved or cleared by the [place] FDA and has been authorized for detection and/or diagnosis of SARS-CoV-2 by FDA under an Emergency Use Authorization (EUA). This EUA will remain in effect (meaning this test can be used) for the duration of the COVID-19 declaration under Section 564(b)(1) of the Act, 21 U.S.C. section 360bbb-3(b)(1), unless the authorization is terminated or revoked.  Performed at [facility], [address] [place], [place] [address]   Culture, blood (Routine X 2) w Reflex to ID Panel     Status: None (Preliminary result)   Collection Time: 02/14/22  4:23 PM   Specimen: BLOOD  Result Value Ref Range Status   Specimen Description   Final    BLOOD BLOOD LEFT HAND Performed at [facility], [address] [place],  [place] [address]    Special Requests   Final    BOTTLES DRAWN AEROBIC ONLY Blood Culture adequate volume Performed at [facility], [address] [place], [place] [address]    Culture   Final    NO GROWTH < 24 HOURS Performed at [facility], [address], [place], [place] [address]    Report Status PENDING  Incomplete  Culture, blood (Routine X 2) w Reflex to ID Panel     Status: None (Preliminary result)   Collection Time: 02/14/22  4:23 PM   Specimen: BLOOD  Result Value Ref Range Status   Specimen Description   Final    BLOOD BLOOD RIGHT HAND Performed at [facility], [address], [place], [place] [address]    Special Requests   Final    BOTTLES DRAWN AEROBIC ONLY Blood Culture adequate volume Performed at [facility], [address] [place], [place] [address]    Culture   Final    NO GROWTH < 24 HOURS Performed at [facility], [address], [place], [place] [address]    Report Status PENDING  Incomplete    Radiographs and labs were personally reviewed by me.   Johny Sax, MD [facility] [phone] 02/15/2022, 2:28 PM

## 2022-02-15 NOTE — Clinical Social Work Note (Signed)
  Transition of Care [facility]) Screening Note   Patient Details  Name: April Cabrera Date of Birth: Apr 19, 1971   Transition of Care [facility]) CM/SW Contact:    Darleene Cleaver, LCSW Phone Number: 02/15/2022, 12:19 PM    Transition of Care Department [facility]) has reviewed patient and no TOC needs have been identified at this time. We will continue to monitor patient advancement through interdisciplinary progression rounds. If new patient transition needs arise, please place a TOC consult.

## 2022-02-15 NOTE — TOC Progression Note (Addendum)
Transition of Care [facility]) - Progression Note    Patient Details  Name: April Cabrera MRN: [id number] Date of Birth: 23-Apr-1971  Transition of Care [facility]) CM/SW Contact  Darleene Cleaver, [place] Phone Number: 02/15/2022, 12:18 PM  Clinical Narrative:    CSW received consult that patient needed medication assistance.    Patient has Medicaid and Medicare Advantage plan, CSW can not assist with medications.            Expected Discharge Plan and Services    Plan to discharge back home once medically ready for discharge.                                             Social Determinants of Health (SDOH) Interventions    Readmission Risk Interventions     No data to display

## 2022-02-16 DIAGNOSIS — Z6841 Body Mass Index (BMI) 40.0 and over, adult: Secondary | ICD-10-CM

## 2022-02-16 DIAGNOSIS — L03116 Cellulitis of left lower limb: Secondary | ICD-10-CM | POA: Diagnosis not present

## 2022-02-16 DIAGNOSIS — R7881 Bacteremia: Secondary | ICD-10-CM | POA: Diagnosis not present

## 2022-02-16 DIAGNOSIS — L89153 Pressure ulcer of sacral region, stage 3: Secondary | ICD-10-CM

## 2022-02-16 DIAGNOSIS — Q054 Unspecified spina bifida with hydrocephalus: Secondary | ICD-10-CM

## 2022-02-16 DIAGNOSIS — E1165 Type 2 diabetes mellitus with hyperglycemia: Secondary | ICD-10-CM

## 2022-02-16 DIAGNOSIS — B9562 Methicillin resistant Staphylococcus aureus infection as the cause of diseases classified elsewhere: Secondary | ICD-10-CM | POA: Diagnosis not present

## 2022-02-16 LAB — COMPREHENSIVE METABOLIC PANEL
ALT: 18 U/L (ref 0–44)
AST: 17 U/L (ref 15–41)
Albumin: 2.9 g/dL — ABNORMAL LOW (ref 3.5–5.0)
Alkaline Phosphatase: 101 U/L (ref 38–126)
Anion gap: 10 (ref 5–15)
BUN: 13 mg/dL (ref 6–20)
CO2: 23 mmol/L (ref 22–32)
Calcium: 8.9 mg/dL (ref 8.9–10.3)
Chloride: 107 mmol/L (ref 98–111)
Creatinine, Ser: 0.35 mg/dL — ABNORMAL LOW (ref 0.44–1.00)
GFR, Estimated: >60 mL/min (ref >60)
Glucose, Bld: 258 mg/dL — ABNORMAL HIGH (ref 70–99)
Potassium: 3.7 mmol/L (ref 3.5–5.1)
Sodium: 140 mmol/L (ref 135–145)
Total Bilirubin: 0.3 mg/dL (ref 0.3–1.2)
Total Protein: 7.4 g/dL (ref 6.5–8.1)

## 2022-02-16 LAB — CBC
HCT: 29.3 % — ABNORMAL LOW (ref 36.0–46.0)
Hemoglobin: 8.8 g/dL — ABNORMAL LOW (ref 12.0–15.0)
MCH: 24.5 pg — ABNORMAL LOW (ref 26.0–34.0)
MCHC: 30 g/dL (ref 30.0–36.0)
MCV: 81.6 fL (ref 80.0–100.0)
Platelets: 456 10*3/uL — ABNORMAL HIGH (ref 150–400)
RBC: 3.59 MIL/uL — ABNORMAL LOW (ref 3.87–5.11)
RDW: 17.9 % — ABNORMAL HIGH (ref 11.5–15.5)
WBC: 8 10*3/uL (ref 4.0–10.5)
nRBC: 0.2 % (ref 0.0–0.2)

## 2022-02-16 LAB — CREATININE, SERUM
Creatinine, Ser: 0.3 mg/dL — ABNORMAL LOW (ref 0.44–1.00)
GFR, Estimated: >60 mL/min (ref >60)

## 2022-02-16 LAB — GLUCOSE, CAPILLARY
Glucose-Capillary: 187 mg/dL — ABNORMAL HIGH (ref 70–99)
Glucose-Capillary: 194 mg/dL — ABNORMAL HIGH (ref 70–99)
Glucose-Capillary: 223 mg/dL — ABNORMAL HIGH (ref 70–99)
Glucose-Capillary: 230 mg/dL — ABNORMAL HIGH (ref 70–99)

## 2022-02-16 LAB — MRSA NEXT GEN BY PCR, NASAL: MRSA by PCR Next Gen: DETECTED — AB

## 2022-02-16 LAB — MAGNESIUM: Magnesium: 1.8 mg/dL (ref 1.7–2.4)

## 2022-02-16 MED ORDER — SODIUM CHLORIDE 0.9 % IV SOLN
8.0000 mg/kg | Freq: Every day | INTRAVENOUS | Status: DC
  Administered 2022-02-16: 500 mg via INTRAVENOUS
  Filled 2022-02-16 (×2): qty 10

## 2022-02-16 NOTE — Progress Notes (Signed)
Pharmacy Antibiotic Note  April Cabrera is a 51 y.o. female with hx spina bifida, paraplegia, neurogenic bladder with suprapubic catheter, chronic sacral decubitus ulcer and osteomyelitis, who presented to the ED on 02/12/22 with c/o hematuria.  Blood cultures collected on 02/12/22 came back with staph epi, staph capitis and MSSA.  She is currently on vancomycin for infection.  Today, 02/16/2022: - day # 4 abx - afeb - scr low (paraplegic)  Plan: - continue with vancomycin 1250 mg IV q24h for now - f/u with recom from ID team.  Will plan on obtaining vancomycin levels if plan is to continue with vancomycin for infectiion.  ______________________________________  Height: 5' 0.24" (153 cm) Weight: 62.1 kg (137 lb) IBW/kg (Calculated) : 46.04  Temp (24hrs), Avg:98.3 F (36.8 C), Min:97.9 F (36.6 C), Max:98.6 F (37 C)  Recent Labs  Lab 02/12/22 1745 02/14/22 0345 02/16/22 0440  WBC 15.1* 9.0  --   CREATININE 0.43* 0.60 0.30*  LATICACIDVEN 1.5  --   --     Estimated Creatinine Clearance: 70.4 mL/min (A) (by C-G formula based on SCr of 0.3 mg/dL (L)).    Allergies  Allergen Reactions   Ciprofloxacin Anaphylaxis   Nitrofurantoin Swelling and Other (See Comments)    Face became swollen and the throat started to swell (Benadryl needed)   Tape Swelling and Other (See Comments)    Facial swelling/redness-pt denies swelling but does state all tape causes redness    Wound Dressing Adhesive Swelling and Other (See Comments)    Facial swelling and redness   Bacitracin Other (See Comments)    Redness    Losartan Other (See Comments)    GI upset    Latex Rash and Other (See Comments)    "Precautions" (?)   Neosporin Original [Neomycin-Bacitracin Zn-Polymyx] Rash and Other (See Comments)    Redness, also    ceftriaxone 7/14 x1  vancomycin 7/15 >> Cefepime 7/15 >>7/18  7/16 bcx x2:  7/14 bcx x2: 1 bottles with staph epi and 2 bottles from other set with MSSA (R= cipro,  erthro, oxacillin); staph capitis 7/14 ucx: multi species, suggest recollection FINAL  Thank you for allowing pharmacy to be a part of this patient's care.  Lucia Gaskins 02/16/2022 8:56 AM

## 2022-02-16 NOTE — Progress Notes (Signed)
Subjective: No new complaints   Antibiotics:  Anti-infectives (From admission, onward)    Start     Dose/Rate Route Frequency Ordered Stop   02/16/22 2000  DAPTOmycin (CUBICIN) 500 mg in sodium chloride 0.9 % IVPB        8 mg/kg  62.1 kg 120 mL/hr over 30 Minutes Intravenous Daily 02/16/22 1056     02/13/22 2100  Vancomycin (VANCOCIN) 1,250 mg in sodium chloride 0.9 % 250 mL IVPB  Status:  Discontinued        1,250 mg 166.7 mL/hr over 90 Minutes Intravenous Every 24 hours 02/13/22 1453 02/13/22 1717   02/13/22 2000  vancomycin (VANCOREADY) IVPB 1250 mg/250 mL  Status:  Discontinued        1,250 mg 166.7 mL/hr over 90 Minutes Intravenous Every 24 hours 02/13/22 1722 02/16/22 1056   02/13/22 1830  ceFEPIme (MAXIPIME) 2 g in sodium chloride 0.9 % 100 mL IVPB  Status:  Discontinued        2 g 200 mL/hr over 30 Minutes Intravenous Every 8 hours 02/13/22 1714 02/16/22 0800   02/12/22 2100  vancomycin (VANCOCIN) IVPB 1000 mg/200 mL premix        1,000 mg 200 mL/hr over 60 Minutes Intravenous  Once 02/12/22 2053 02/12/22 2300   02/12/22 1745  cefTRIAXone (ROCEPHIN) 2 g in sodium chloride 0.9 % 100 mL IVPB  Status:  Discontinued        2 g 200 mL/hr over 30 Minutes Intravenous Every 24 hours 02/12/22 1739 02/13/22 1655       Medications: Scheduled Meds:  darifenacin  7.5 mg Oral Daily   enoxaparin (LOVENOX) injection  40 mg Subcutaneous Q24H   Gerhardt's butt cream   Topical Daily   glipiZIDE  2.5 mg Oral Q breakfast   insulin aspart  0-15 Units Subcutaneous TID WC   iron polysaccharides  300 mg Oral Daily   linagliptin  5 mg Oral Daily   loratadine  10 mg Oral Daily   mirabegron ER  50 mg Oral Daily   mirabegron ER  50 mg Oral Once   pneumococcal 20-valent conjugate vaccine  0.5 mL Intramuscular Tomorrow-1000   Continuous Infusions:  DAPTOmycin (CUBICIN) 500 mg in sodium chloride 0.9 % IVPB     lactated ringers 100 mL/hr at 02/16/22 0107   PRN  Meds:.acetaminophen    Objective: Weight change:   Intake/Output Summary (Last 24 hours) at 02/16/2022 1507 Last data filed at 02/16/2022 0935 Gross per 24 hour  Intake 1859.47 ml  Output 2950 ml  Net -1090.53 ml   Blood pressure 124/78, pulse 89, temperature 98.4 F (36.9 C), temperature source Oral, resp. rate 16, height 5' 0.24" (1.53 m), weight 62.1 kg, SpO2 98 %. Temp:  [98.4 F (36.9 C)-98.6 F (37 C)] 98.4 F (36.9 C) (07/18 1238) Pulse Rate:  [84-89] 89 (07/18 1238) Resp:  [16-20] 16 (07/18 1238) BP: (120-125)/(73-78) 124/78 (07/18 1238) SpO2:  [96 %-98 %] 98 % (07/18 1238)  Physical Exam: Physical Exam Cardiovascular:     Rate and Rhythm: Normal rate and regular rhythm.  Pulmonary:     Effort: Pulmonary effort is normal. No respiratory distress.     Breath sounds: No wheezing.  Abdominal:     General: There is no distension.  Neurological:     Mental Status: She is alert and oriented to person, place, and time.  Psychiatric:        Attention and Perception: Attention normal.  Speech: Speech is delayed.        Behavior: Behavior normal. Behavior is cooperative.     Erythema of LLE is improved  CBC:    BMET Recent Labs    02/14/22 0345 02/16/22 0440 02/16/22 1353  NA 142  --  140  K 3.1*  --  3.7  CL 112*  --  107  CO2 22  --  23  GLUCOSE 179*  --  258*  BUN 13  --  13  CREATININE 0.60 0.30* 0.35*  CALCIUM 8.2*  --  8.9     Liver Panel  Recent Labs    02/16/22 1353  PROT 7.4  ALBUMIN 2.9*  AST 17  ALT 18  ALKPHOS 101  BILITOT 0.3       Sedimentation Rate No results for input(s): "ESRSEDRATE" in the last 72 hours. C-Reactive Protein No results for input(s): "CRP" in the last 72 hours.  Micro Results: Recent Results (from the past 720 hour(s))  Urine Culture     Status: Abnormal   Collection Time: 02/12/22  5:30 PM   Specimen: In/Out Cath Urine  Result Value Ref Range Status   Specimen Description   Final    IN/OUT  CATH URINE Performed at [facility], [address] [place], [place] [address]    Special Requests   Final    NONE Performed at [facility], [address] [place], [place] [address]    Culture MULTIPLE SPECIES PRESENT, SUGGEST RECOLLECTION (A)  Final   Report Status 02/14/2022 FINAL  Final  Blood Culture (routine x 2)     Status: Abnormal   Collection Time: 02/12/22  5:58 PM   Specimen: BLOOD  Result Value Ref Range Status   Specimen Description   Final    BLOOD RIGHT ANTECUBITAL Performed at [facility], [address] [place], [place] [address]    Special Requests   Final    Blood Culture adequate volume BOTTLES DRAWN AEROBIC AND ANAEROBIC Performed at [facility], [address] [place], [place] [address]    Culture  Setup Time   Final    GRAM POSITIVE COCCI IN CLUSTERS IN BOTH AEROBIC AND ANAEROBIC BOTTLES CRITICAL RESULT CALLED TO, READ BACK BY AND VERIFIED WITH: RN S.JOUGE AT 1315 ON 02/13/2022 BY T.SAAD. Performed at [facility], [address] [place], [place] [address]    Culture METHICILLIN RESISTANT STAPHYLOCOCCUS AUREUS (A)  Final   Report Status 02/15/2022 FINAL  Final   Organism ID, Bacteria METHICILLIN RESISTANT STAPHYLOCOCCUS AUREUS  Final      Susceptibility   Methicillin resistant staphylococcus aureus - MIC*    CIPROFLOXACIN >=8 RESISTANT Resistant     ERYTHROMYCIN >=8 RESISTANT Resistant     GENTAMICIN <=0.5 SENSITIVE Sensitive     OXACILLIN >=4 RESISTANT Resistant     TETRACYCLINE <=1 SENSITIVE Sensitive     VANCOMYCIN 1 SENSITIVE Sensitive     TRIMETH/SULFA <=10 SENSITIVE Sensitive     CLINDAMYCIN <=0.25 SENSITIVE Sensitive     RIFAMPIN <=0.5 SENSITIVE Sensitive     Inducible Clindamycin NEGATIVE Sensitive     * METHICILLIN RESISTANT STAPHYLOCOCCUS AUREUS  Blood Culture ID Panel (Reflexed)     Status: Abnormal   Collection Time: 02/12/22  5:58 PM  Result Value Ref Range Status   Enterococcus  faecalis NOT DETECTED NOT DETECTED Final   Enterococcus Faecium NOT DETECTED NOT DETECTED Final   Listeria monocytogenes NOT DETECTED  NOT DETECTED Final   Staphylococcus species DETECTED (A) NOT DETECTED Final    Comment: CRITICAL RESULT CALLED TO, READ BACK BY AND VERIFIED WITH: RN S.JOUGE AT 1315 ON 02/13/2022 BY T.SAAD.    Staphylococcus aureus (BCID) DETECTED (A) NOT DETECTED Final    Comment: Methicillin (oxacillin)-resistant Staphylococcus aureus (MRSA). MRSA is predictably resistant to beta-lactam antibiotics (except ceftaroline). Preferred therapy is vancomycin unless clinically contraindicated. Patient requires contact precautions if  hospitalized. CRITICAL RESULT CALLED TO, READ BACK BY AND VERIFIED WITH: RN S.JOUGE AT 1315 ON 02/13/2022 BY T.SAAD.    Staphylococcus epidermidis NOT DETECTED NOT DETECTED Final   Staphylococcus lugdunensis NOT DETECTED NOT DETECTED Final   Streptococcus species NOT DETECTED NOT DETECTED Final   Streptococcus agalactiae NOT DETECTED NOT DETECTED Final   Streptococcus pneumoniae NOT DETECTED NOT DETECTED Final   Streptococcus pyogenes NOT DETECTED NOT DETECTED Final   A.calcoaceticus-baumannii NOT DETECTED NOT DETECTED Final   Bacteroides fragilis NOT DETECTED NOT DETECTED Final   Enterobacterales NOT DETECTED NOT DETECTED Final   Enterobacter cloacae complex NOT DETECTED NOT DETECTED Final   Escherichia coli NOT DETECTED NOT DETECTED Final   Klebsiella aerogenes NOT DETECTED NOT DETECTED Final   Klebsiella oxytoca NOT DETECTED NOT DETECTED Final   Klebsiella pneumoniae NOT DETECTED NOT DETECTED Final   Proteus species NOT DETECTED NOT DETECTED Final   Salmonella species NOT DETECTED NOT DETECTED Final   Serratia marcescens NOT DETECTED NOT DETECTED Final   Haemophilus influenzae NOT DETECTED NOT DETECTED Final   Neisseria meningitidis NOT DETECTED NOT DETECTED Final   Pseudomonas aeruginosa NOT DETECTED NOT DETECTED Final   Stenotrophomonas  maltophilia NOT DETECTED NOT DETECTED Final   Candida albicans NOT DETECTED NOT DETECTED Final   Candida auris NOT DETECTED NOT DETECTED Final   Candida glabrata NOT DETECTED NOT DETECTED Final   Candida krusei NOT DETECTED NOT DETECTED Final   Candida parapsilosis NOT DETECTED NOT DETECTED Final   Candida tropicalis NOT DETECTED NOT DETECTED Final   Cryptococcus neoformans/gattii NOT DETECTED NOT DETECTED Final   Meth resistant mecA/C and MREJ DETECTED (A) NOT DETECTED Final    Comment: CRITICAL RESULT CALLED TO, READ BACK BY AND VERIFIED WITH: RN S.JOUGE AT 1315 ON 02/13/2022 BY T.SAAD. Performed at [facility], [address], [place], [place] [address]   Blood Culture (routine x 2)     Status: Abnormal   Collection Time: 02/12/22  6:10 PM   Specimen: BLOOD  Result Value Ref Range Status   Specimen Description   Final    BLOOD LEFT ANTECUBITAL Performed at [facility], [address] [place], [place] [address]    Special Requests   Final    Blood Culture results may not be optimal due to an inadequate volume of blood received in culture bottles BOTTLES DRAWN AEROBIC AND ANAEROBIC Performed at [facility], [address] [place], [place] [address]    Culture  Setup Time   Final    GRAM POSITIVE COCCI IN CLUSTERS AEROBIC BOTTLE ONLY CRITICAL VALUE NOTED.  VALUE IS CONSISTENT WITH PREVIOUSLY REPORTED AND CALLED VALUE.    Culture (A)  Final    STAPHYLOCOCCUS CAPITIS STAPHYLOCOCCUS EPIDERMIDIS THE SIGNIFICANCE OF ISOLATING THIS ORGANISM FROM A SINGLE SET OF BLOOD CULTURES WHEN MULTIPLE SETS ARE DRAWN IS UNCERTAIN. PLEASE NOTIFY THE MICROBIOLOGY DEPARTMENT WITHIN ONE WEEK IF SPECIATION AND SENSITIVITIES ARE REQUIRED. CRITICAL RESULT CALLED TO, READ BACK BY AND VERIFIED WITH: PHARMD NICK  G. [id number] FCP Performed  at [facility], [address], [place], [place] [address]    Report Status 02/15/2022 FINAL  Final  Resp Panel by RT-PCR (Flu A&B,  Covid)     Status: None   Collection Time: 02/12/22  6:19 PM   Specimen: Nasal Swab  Result Value Ref Range Status   SARS Coronavirus 2 by RT PCR NEGATIVE NEGATIVE Final    Comment: (NOTE) SARS-CoV-2 target nucleic acids are NOT DETECTED.  The SARS-CoV-2 RNA is generally detectable in upper respiratory specimens during the acute phase of infection. The lowest concentration of SARS-CoV-2 viral copies this assay can detect is 138 copies/mL. A negative result does not preclude SARS-Cov-2 infection and should not be used as the sole basis for treatment or other patient management decisions. A negative result may occur with  improper specimen collection/handling, submission of specimen other than nasopharyngeal swab, presence of viral mutation(s) within the areas targeted by this assay, and inadequate number of viral copies(<138 copies/mL). A negative result must be combined with clinical observations, patient history, and epidemiological information. The expected result is Negative.  Fact Sheet for Patients:  BloggerCourse.com  Fact Sheet for Healthcare Providers:  SeriousBroker.it  This test is no t yet approved or cleared by the [place] FDA and  has been authorized for detection and/or diagnosis of SARS-CoV-2 by FDA under an Emergency Use Authorization (EUA). This EUA will remain  in effect (meaning this test can be used) for the duration of the COVID-19 declaration under Section 564(b)(1) of the Act, 21 U.S.C.section 360bbb-3(b)(1), unless the authorization is terminated  or revoked sooner.       Influenza A by PCR NEGATIVE NEGATIVE Final   Influenza B by PCR NEGATIVE NEGATIVE Final    Comment: (NOTE) The Xpert Xpress SARS-CoV-2/FLU/RSV plus assay is intended as an aid in the diagnosis of influenza from Nasopharyngeal swab specimens and should not be used as a sole basis for treatment. Nasal washings and aspirates are  unacceptable for Xpert Xpress SARS-CoV-2/FLU/RSV testing.  Fact Sheet for Patients: BloggerCourse.com  Fact Sheet for Healthcare Providers: SeriousBroker.it  This test is not yet approved or cleared by the [place] FDA and has been authorized for detection and/or diagnosis of SARS-CoV-2 by FDA under an Emergency Use Authorization (EUA). This EUA will remain in effect (meaning this test can be used) for the duration of the COVID-19 declaration under Section 564(b)(1) of the Act, 21 U.S.C. section 360bbb-3(b)(1), unless the authorization is terminated or revoked.  Performed at [facility], [address] [place], [place] [address]   Culture, blood (Routine X 2) w Reflex to ID Panel     Status: None (Preliminary result)   Collection Time: 02/14/22  4:23 PM   Specimen: BLOOD  Result Value Ref Range Status   Specimen Description   Final    BLOOD BLOOD LEFT HAND Performed at [facility], [address], [place], [place] [address]    Special Requests   Final    BOTTLES DRAWN AEROBIC ONLY Blood Culture adequate volume Performed at [facility], [address], [place], [place] [address]    Culture   Final    NO GROWTH 2 DAYS Performed at [facility], [address] [place], [place] [address]    Report Status PENDING  Incomplete  Culture, blood (Routine X 2) w Reflex to ID Panel     Status: None (Preliminary result)   Collection Time: 02/14/22  4:23 PM  Specimen: BLOOD  Result Value Ref Range Status   Specimen Description   Final    BLOOD BLOOD RIGHT HAND Performed at [facility], [address], [place], [place] [address]    Special Requests   Final    BOTTLES DRAWN AEROBIC ONLY Blood Culture adequate volume Performed at [facility], [address] [place], [place] [address]    Culture   Final    NO GROWTH 2  DAYS Performed at [facility], [address], [place], [place] [address]    Report Status PENDING  Incomplete  MRSA Next Gen by PCR, Nasal     Status: Abnormal   Collection Time: 02/16/22  8:50 AM   Specimen: Nasal Mucosa; Nasal Swab  Result Value Ref Range Status   MRSA by PCR Next Gen DETECTED (A) NOT DETECTED Final    Comment: (NOTE) The GeneXpert MRSA Assay (FDA approved for NASAL specimens only), is one component of a comprehensive MRSA colonization surveillance program. It is not intended to diagnose MRSA infection nor to guide or monitor treatment for MRSA infections. Test performance is not FDA approved in patients less than 82 years old. Performed at [facility], [address], [place], [place] [address]     Studies/Results: ECHOCARDIOGRAM COMPLETE  Result Date: 02/15/2022    ECHOCARDIOGRAM REPORT   Patient Name:   April Cabrera Date of Exam: 02/15/2022 Medical Rec #:  [id number]        Height:       60.2 in Accession #:    [id number]       Weight:       137.0 lb Date of Birth:  [birth year]        BSA:          1.594 m Patient Age:    51 years         BP:           127/82 mmHg Patient Gender: F                HR:           96 bpm. Exam Location:  Inpatient Procedure: 2D Echo, Cardiac Doppler and Color Doppler Indications:    Bacteremia  History:        Patient has no prior history of Echocardiogram examinations.                 Risk Factors:Hypertension and Diabetes.  Sonographer:    Cleatis Polka Referring Phys: [id number] [facility] IMPRESSIONS  1. Left ventricular ejection fraction, by estimation, is 65 to 70%. The left ventricle has normal function. The left ventricle has no regional wall motion abnormalities. Left ventricular diastolic parameters were normal.  2. Right ventricular systolic function is normal. The right ventricular size is normal.  3. The mitral valve is grossly normal. No evidence of mitral valve regurgitation. No evidence of mitral  stenosis.  4. The aortic valve was not well visualized. Aortic valve regurgitation is not visualized. No aortic stenosis is present.  5. The inferior vena cava is normal in size with <50% respiratory variability, suggesting right atrial pressure of 8 mmHg. Comparison(s): No prior Echocardiogram. Conclusion(s)/Recommendation(s): Normal biventricular function without evidence of hemodynamically significant valvular heart disease. No evidence of valvular vegetations on this transthoracic echocardiogram. Consider a transesophageal echocardiogram to exclude infective endocarditis if clinically indicated. FINDINGS  Left Ventricle: Left ventricular ejection fraction, by estimation, is 65 to 70%. The left ventricle has normal  function. The left ventricle has no regional wall motion abnormalities. The left ventricular internal cavity size was normal in size. There is  no left ventricular hypertrophy. Left ventricular diastolic parameters were normal. Right Ventricle: The right ventricular size is normal. No increase in right ventricular wall thickness. Right ventricular systolic function is normal. Left Atrium: Left atrial size was normal in size. Right Atrium: Right atrial size was normal in size. Pericardium: Trivial pericardial effusion is present. Mitral Valve: The mitral valve is grossly normal. No evidence of mitral valve regurgitation. No evidence of mitral valve stenosis. Tricuspid Valve: The tricuspid valve is not well visualized. Tricuspid valve regurgitation is not demonstrated. No evidence of tricuspid stenosis. Aortic Valve: The aortic valve was not well visualized. Aortic valve regurgitation is not visualized. No aortic stenosis is present. Aortic valve peak gradient measures 11.6 mmHg. Pulmonic Valve: The pulmonic valve was not well visualized. Pulmonic valve regurgitation is not visualized. Aorta: The aortic root, ascending aorta, aortic arch and descending aorta are all structurally normal, with no evidence  of dilitation or obstruction. Venous: The inferior vena cava is normal in size with less than 50% respiratory variability, suggesting right atrial pressure of 8 mmHg. IAS/Shunts: The atrial septum is grossly normal.  LEFT VENTRICLE PLAX 2D LVIDd:         3.60 cm     Diastology LVIDs:         2.30 cm     LV e' medial:    8.05 cm/s LV PW:         1.00 cm     LV E/e' medial:  13.4 LV IVS:        0.60 cm     LV e' lateral:   8.38 cm/s LVOT diam:     2.00 cm     LV E/e' lateral: 12.9 LV SV:         70 LV SV Index:   44 LVOT Area:     3.14 cm  LV Volumes (MOD) LV vol d, MOD A2C: 68.8 ml LV vol d, MOD A4C: 77.8 ml LV vol s, MOD A2C: 24.2 ml LV vol s, MOD A4C: 26.4 ml LV SV MOD A2C:     44.6 ml LV SV MOD A4C:     77.8 ml LV SV MOD BP:      48.9 ml RIGHT VENTRICLE             IVC RV Basal diam:  2.00 cm     IVC diam: 1.60 cm RV Mid diam:    1.90 cm RV S prime:     14.70 cm/s TAPSE (M-mode): 2.5 cm LEFT ATRIUM             Index        RIGHT ATRIUM          Index LA diam:        2.90 cm 1.82 cm/m   RA Area:     8.99 cm LA Vol (A2C):   24.2 ml 15.18 ml/m  RA Volume:   13.10 ml 8.22 ml/m LA Vol (A4C):   23.7 ml 14.87 ml/m LA Biplane Vol: 25.1 ml 15.75 ml/m  AORTIC VALVE AV Area (Vmax): 2.16 cm AV Vmax:        170.00 cm/s AV Peak Grad:   11.6 mmHg LVOT Vmax:      117.00 cm/s LVOT Vmean:     85.500 cm/s LVOT VTI:       0.223 m  AORTA Ao  Root diam: 2.60 cm Ao Asc diam:  2.70 cm MITRAL VALVE MV Area (PHT): 5.27 cm     SHUNTS MV Decel Time: 144 msec     Systemic VTI:  0.22 m MV E velocity: 108.00 cm/s  Systemic Diam: 2.00 cm MV A velocity: 84.50 cm/s MV E/A ratio:  1.28 Jodelle Red MD Electronically signed by Jodelle Red MD Signature Date/Time: 02/15/2022/7:27:37 PM    Final       Assessment/Plan:  INTERVAL HISTORY:    Principal Problem:   MRSA bacteremia Active Problems:   Sepsis (HCC)   Suprapubic catheter (HCC)   Essential hypertension   Morbid obesity with BMI of 45.0-49.9, adult (HCC)    Type 2 diabetes mellitus with hyperglycemia, without long-term current use of insulin (HCC)   Cellulitis of left lower extremity   Sacral decubitus ulcer   Colostomy in place [facility])    April Cabrera is a 51 y.o. female with spina bifida, decubitus ulcers, sp multiple I&D in January 2023 with OR Cx+ PsA and corynebacterium and split thickness graft on 08/25/21(Followed by [facility] ID) treated with daptomycin +cefepime x 6 weeks EOT 10/02/21 now admitted with MRSA bacteremia.  #1 MRSA bacteremia:  Source could be LE area that was cellulitic, or decubitus ulcers though they are apparently better  TTE clean  One could pursue TEE  I would personally err on the side of just giving her 6 weeks of parenteral antibiotics  I am narrowing to cover her with daptomycin and doing away with pseudmonal coverage since she has not had documented recent active infection with this organism.  She needs to prove she is clearing her bacteremia prior to PICC placement     LOS: 3 days   Acey Lav 02/16/2022, 3:07 PM

## 2022-02-16 NOTE — Progress Notes (Signed)
Received request to evaluate a leaking suprapubic catheter.  Upon inquisition, discovered Ms. Outten tube is managed by Urology and is a 28Fr (a size IR does not carry).  Recommend consulting with urology for catheter management.     Electronically Signed: Sheliah Plane, PA-C 02/16/2022, 1:26 PM

## 2022-02-16 NOTE — Progress Notes (Addendum)
PROGRESS NOTE    April Cabrera  QQV:[id number] DOB: 05/27/1971 DOA: 02/12/2022 PCP: Malka So., MD   Brief Narrative: 51 year old female with history of paraplegia and neurogenic bladder with suprapubic catheter chronic sacral decubitus ulcer spina bifid lives at home with her mother admitted with hematuria.  Patient had recent hospitalization for osteomyelitis of the pelvis.  She has a history significant for diabetes and hypertension.  Patient was also treated with daptomycin and cefepime in January 2023. Patient was brought to the ED by family due to gross hematuria.  Patient has a suprapubic catheter changed every 2 weeks.  There were no clots observed.  She had some suprapubic pain and tenderness.  No fever chills reported by the family. She received vancomycin and Rocephin in the ER. Assessment & Plan:   Principal Problem:   MRSA bacteremia Active Problems:   Sepsis (HCC)   Suprapubic catheter (HCC)   Essential hypertension   Morbid obesity with BMI of 45.0-49.9, adult (HCC)   Type 2 diabetes mellitus with hyperglycemia, without long-term current use of insulin (HCC)   Cellulitis of left lower extremity   Sacral decubitus ulcer   Colostomy in place [facility])  #1 mrsa bacteremia /left lower extremity cellulitis Repeat blood cultures negative On vancomycin and cefepime Echocardiogram-02/15/2022 EF 65 to 70%.  Left ventricular normal function.  No regional wall motion abnormalities.  No evidence of mitral valve or aortic valve vegetation. Urine culture with multiple species. Leukocytosis improved to 9.0 from 15.1.  Patient with history of osteomyelitis of the pelvis.  #2 hypokalemia potassium 3.1 replete.  Check mag level.  #3 chronic anemia no evidence of active bleeding.  Had some gross hematuria on admission.  Hemoglobin stable.  #4 stage IV sacral decubitus ulcer appreciate wound care follow-up.  On vancomycin and cefepime. MRI of the sacrum and pelvis-large chronic  sacral decubitus ulcer with small amount of fluid along the left margin of the ulcer base.  No well-defined or rim-enhancing fluid collection.  Surgical absence of the distal sacrum and coccyx no evidence of acute sacral osteomyelitis.  Congenital spina bifida.  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  #5 gross hematuria in the setting of neurogenic bladder and suprapubic catheter.  Urine culture with no growth.  Her catheter was changed past Monday, 02/08/2022.  #6 history of colostomy  #7 type 2 diabetes-CBG (last 3) on SSI Restart glipizide and Tradjenta Hold metformin Recent Labs    02/15/22 2022 02/16/22 0737 02/16/22 1143  GLUCAP 212* 187* 194*      Pressure Injury 02/13/22 Buttocks Right;Left;Upper;Mid;Lower Stage 4 - Full thickness tissue loss with exposed bone, tendon or muscle. (Active)  02/13/22 1741  Location: Buttocks  Location Orientation: Right;Left;Upper;Mid;Lower  Staging: Stage 4 - Full thickness tissue loss with exposed bone, tendon or muscle.  Wound Description (Comments):   Present on Admission: Yes  Dressing Type Alginate 02/15/22 2144     Pressure Injury 02/13/22 Knee Anterior;Left Stage 2 -  Partial thickness loss of dermis presenting as a shallow open injury with a red, pink wound bed without slough. (Active)  02/13/22 1742  Location: Knee  Location Orientation: Anterior;Left  Staging: Stage 2 -  Partial thickness loss of dermis presenting as a shallow open injury with a red, pink wound bed without slough.  Wound Description (Comments):   Present on Admission: Yes  Dressing Type Foam - Lift dressing to assess site every shift 02/16/22 0800     Estimated body mass index is  26.55 kg/m as calculated from the following:   Height as of this encounter: 5' 0.24" (1.53 m).   Weight as of this encounter: 62.1 kg.  DVT prophylaxis: Lovenox  code Status: Full code  family Communication: Her brother-in-law was at  bedside disposition Plan:  Status is: Inpatient Remains inpatient appropriate because: Bacteremia   Consultants:  ID Procedures: None Antimicrobials: Vancomycin and cefepime  Subjective:  Patient is resting in bed in no acute distress overnight staff complaints of patient having leakage of urine surrounding her suprapubic catheter. Objective: Vitals:   02/15/22 1331 02/15/22 2021 02/16/22 0453 02/16/22 1238  BP: 122/71 120/73 125/76 124/78  Pulse: 100 84 89 89  Resp: 16 20 20 16   Temp: 97.9 F (36.6 C) 98.4 F (36.9 C) 98.6 F (37 C) 98.4 F (36.9 C)  TempSrc: Oral Oral Oral Oral  SpO2: 96% 97% 96% 98%  Weight:      Height:        Intake/Output Summary (Last 24 hours) at 02/16/2022 1323 Last data filed at 02/16/2022 0935 Gross per 24 hour  Intake 1859.47 ml  Output 2950 ml  Net -1090.53 ml    Filed Weights   02/12/22 1743  Weight: 62.1 kg    Examination:  General exam: Appears in no acute distress Respiratory system: Clear to auscultation. Respiratory effort normal. Cardiovascular system: S1 & S2 heard, RRR. No JVD, murmurs, rubs, gallops or clicks. No pedal edema. Gastrointestinal system: Abdomen is nondistended, soft and nontender. No organomegaly or masses felt. Normal bowel sounds heard.  Suprapubic catheter in place left colostomy with liquid stool noted Central nervous system: Alert and oriented. No focal neurological deficits. Extremities: Contracted Skin: No rashes, lesions or ulcers Psychiatry: Judgement and insight appear normal. Mood & affect appropriate.     Data Reviewed: I have personally reviewed following labs and imaging studies  CBC: Recent Labs  Lab 02/12/22 1745 02/14/22 0345  WBC 15.1* 9.0  NEUTROABS 12.4*  --   HGB 10.6* 8.9*  HCT 33.2* 29.1*  MCV 78.7* 81.3  PLT 433* 395    Basic Metabolic Panel: Recent Labs  Lab 02/12/22 1745 02/14/22 0345 02/16/22 0440  NA 134* 142  --   K 3.8 3.1*  --   CL 101 112*  --   CO2 22 22   --   GLUCOSE 189* 179*  --   BUN 14 13  --   CREATININE 0.43* 0.60 0.30*  CALCIUM 9.2 8.2*  --     GFR: Estimated Creatinine Clearance: 70.4 mL/min (A) (by C-G formula based on SCr of 0.3 mg/dL (L)). Liver Function Tests: Recent Labs  Lab 02/12/22 1745  AST 19  ALT 20  ALKPHOS 129*  BILITOT 0.4  PROT 8.2*  ALBUMIN 3.1*    No results for input(s): "LIPASE", "AMYLASE" in the last 168 hours. No results for input(s): "AMMONIA" in the last 168 hours. Coagulation Profile: Recent Labs  Lab 02/12/22 1803  INR 1.2    Cardiac Enzymes: No results for input(s): "CKTOTAL", "CKMB", "CKMBINDEX", "TROPONINI" in the last 168 hours. BNP (last 3 results) No results for input(s): "PROBNP" in the last 8760 hours. HbA1C: No results for input(s): "HGBA1C" in the last 72 hours. CBG: Recent Labs  Lab 02/15/22 1136 02/15/22 1619 02/15/22 2022 02/16/22 0737 02/16/22 1143  GLUCAP 185* 217* 212* 187* 194*    Lipid Profile: No results for input(s): "CHOL", "HDL", "LDLCALC", "TRIG", "CHOLHDL", "LDLDIRECT" in the last 72 hours. Thyroid Function Tests: No results for input(s): "TSH", "  T4TOTAL", "FREET4", "T3FREE", "THYROIDAB" in the last 72 hours. Anemia Panel: No results for input(s): "VITAMINB12", "FOLATE", "FERRITIN", "TIBC", "IRON", "RETICCTPCT" in the last 72 hours. Sepsis Labs: Recent Labs  Lab 02/12/22 1745  LATICACIDVEN 1.5     Recent Results (from the past 240 hour(s))  Urine Culture     Status: Abnormal   Collection Time: 02/12/22  5:30 PM   Specimen: In/Out Cath Urine  Result Value Ref Range Status   Specimen Description   Final    IN/OUT CATH URINE Performed at [facility], [address] [place], [place] [address]    Special Requests   Final    NONE Performed at [facility], [address] [place], [place] [address]    Culture MULTIPLE SPECIES PRESENT, SUGGEST RECOLLECTION (A)  Final   Report Status 02/14/2022 FINAL  Final  Blood  Culture (routine x 2)     Status: Abnormal   Collection Time: 02/12/22  5:58 PM   Specimen: BLOOD  Result Value Ref Range Status   Specimen Description   Final    BLOOD RIGHT ANTECUBITAL Performed at [facility], [address], [place], [place] [address]    Special Requests   Final    Blood Culture adequate volume BOTTLES DRAWN AEROBIC AND ANAEROBIC Performed at [facility], [address] [place], [place] [address]    Culture  Setup Time   Final    GRAM POSITIVE COCCI IN CLUSTERS IN BOTH AEROBIC AND ANAEROBIC BOTTLES CRITICAL RESULT CALLED TO, READ BACK BY AND VERIFIED WITH: RN S.JOUGE AT 1315 ON 02/13/2022 BY T.SAAD. Performed at [facility], [address], [place], [place] [address]    Culture METHICILLIN RESISTANT STAPHYLOCOCCUS AUREUS (A)  Final   Report Status 02/15/2022 FINAL  Final   Organism ID, Bacteria METHICILLIN RESISTANT STAPHYLOCOCCUS AUREUS  Final      Susceptibility   Methicillin resistant staphylococcus aureus - MIC*    CIPROFLOXACIN >=8 RESISTANT Resistant     ERYTHROMYCIN >=8 RESISTANT Resistant     GENTAMICIN <=0.5 SENSITIVE Sensitive     OXACILLIN >=4 RESISTANT Resistant     TETRACYCLINE <=1 SENSITIVE Sensitive     VANCOMYCIN 1 SENSITIVE Sensitive     TRIMETH/SULFA <=10 SENSITIVE Sensitive     CLINDAMYCIN <=0.25 SENSITIVE Sensitive     RIFAMPIN <=0.5 SENSITIVE Sensitive     Inducible Clindamycin NEGATIVE Sensitive     * METHICILLIN RESISTANT STAPHYLOCOCCUS AUREUS  Blood Culture ID Panel (Reflexed)     Status: Abnormal   Collection Time: 02/12/22  5:58 PM  Result Value Ref Range Status   Enterococcus faecalis NOT DETECTED NOT DETECTED Final   Enterococcus Faecium NOT DETECTED NOT DETECTED Final   Listeria monocytogenes NOT DETECTED NOT DETECTED Final   Staphylococcus species DETECTED (A) NOT DETECTED Final    Comment: CRITICAL RESULT CALLED TO, READ BACK BY AND VERIFIED WITH: RN S.JOUGE AT 1315 ON 02/13/2022 BY T.SAAD.     Staphylococcus aureus (BCID) DETECTED (A) NOT DETECTED Final    Comment: Methicillin (oxacillin)-resistant Staphylococcus aureus (MRSA). MRSA is predictably resistant to beta-lactam antibiotics (except ceftaroline). Preferred therapy is vancomycin unless clinically contraindicated. Patient requires contact precautions if  hospitalized. CRITICAL RESULT CALLED TO, READ BACK BY AND VERIFIED WITH: RN S.JOUGE AT 1315 ON 02/13/2022 BY T.SAAD.    Staphylococcus epidermidis NOT DETECTED NOT DETECTED Final   Staphylococcus lugdunensis NOT DETECTED NOT DETECTED Final   Streptococcus species NOT DETECTED NOT  DETECTED Final   Streptococcus agalactiae NOT DETECTED NOT DETECTED Final   Streptococcus pneumoniae NOT DETECTED NOT DETECTED Final   Streptococcus pyogenes NOT DETECTED NOT DETECTED Final   A.calcoaceticus-baumannii NOT DETECTED NOT DETECTED Final   Bacteroides fragilis NOT DETECTED NOT DETECTED Final   Enterobacterales NOT DETECTED NOT DETECTED Final   Enterobacter cloacae complex NOT DETECTED NOT DETECTED Final   Escherichia coli NOT DETECTED NOT DETECTED Final   Klebsiella aerogenes NOT DETECTED NOT DETECTED Final   Klebsiella oxytoca NOT DETECTED NOT DETECTED Final   Klebsiella pneumoniae NOT DETECTED NOT DETECTED Final   Proteus species NOT DETECTED NOT DETECTED Final   Salmonella species NOT DETECTED NOT DETECTED Final   Serratia marcescens NOT DETECTED NOT DETECTED Final   Haemophilus influenzae NOT DETECTED NOT DETECTED Final   Neisseria meningitidis NOT DETECTED NOT DETECTED Final   Pseudomonas aeruginosa NOT DETECTED NOT DETECTED Final   Stenotrophomonas maltophilia NOT DETECTED NOT DETECTED Final   Candida albicans NOT DETECTED NOT DETECTED Final   Candida auris NOT DETECTED NOT DETECTED Final   Candida glabrata NOT DETECTED NOT DETECTED Final   Candida krusei NOT DETECTED NOT DETECTED Final   Candida parapsilosis NOT DETECTED NOT DETECTED Final   Candida tropicalis NOT DETECTED  NOT DETECTED Final   Cryptococcus neoformans/gattii NOT DETECTED NOT DETECTED Final   Meth resistant mecA/C and MREJ DETECTED (A) NOT DETECTED Final    Comment: CRITICAL RESULT CALLED TO, READ BACK BY AND VERIFIED WITH: RN S.JOUGE AT 1315 ON 02/13/2022 BY T.SAAD. Performed at [facility], [address], [place], [place] [address]   Blood Culture (routine x 2)     Status: Abnormal   Collection Time: 02/12/22  6:10 PM   Specimen: BLOOD  Result Value Ref Range Status   Specimen Description   Final    BLOOD LEFT ANTECUBITAL Performed at [facility], [address], [place], [place] [address]    Special Requests   Final    Blood Culture results may not be optimal due to an inadequate volume of blood received in culture bottles BOTTLES DRAWN AEROBIC AND ANAEROBIC Performed at [facility], [address] [place], [place] [address]    Culture  Setup Time   Final    GRAM POSITIVE COCCI IN CLUSTERS AEROBIC BOTTLE ONLY CRITICAL VALUE NOTED.  VALUE IS CONSISTENT WITH PREVIOUSLY REPORTED AND CALLED VALUE.    Culture (A)  Final    STAPHYLOCOCCUS CAPITIS STAPHYLOCOCCUS EPIDERMIDIS THE SIGNIFICANCE OF ISOLATING THIS ORGANISM FROM A SINGLE SET OF BLOOD CULTURES WHEN MULTIPLE SETS ARE DRAWN IS UNCERTAIN. PLEASE NOTIFY THE MICROBIOLOGY DEPARTMENT WITHIN ONE WEEK IF SPECIATION AND SENSITIVITIES ARE REQUIRED. CRITICAL RESULT CALLED TO, READ BACK BY AND VERIFIED WITH: PHARMD NICK  [phone] FCP Performed at [facility], [address], [place], [place] [address]    Report Status 02/15/2022 FINAL  Final  Resp Panel by RT-PCR (Flu A&B, Covid)     Status: None   Collection Time: 02/12/22  6:19 PM   Specimen: Nasal Swab  Result Value Ref Range Status   SARS Coronavirus 2 by RT PCR NEGATIVE NEGATIVE Final    Comment: (NOTE) SARS-CoV-2 target nucleic acids are NOT DETECTED.  The SARS-CoV-2 RNA is generally detectable in upper respiratory specimens during the acute  phase of infection. The lowest concentration of SARS-CoV-2 viral copies this assay can detect is 138 copies/mL. A negative result does not preclude SARS-Cov-2 infection and should not be  used as the sole basis for treatment or other patient management decisions. A negative result may occur with  improper specimen collection/handling, submission of specimen other than nasopharyngeal swab, presence of viral mutation(s) within the areas targeted by this assay, and inadequate number of viral copies(<138 copies/mL). A negative result must be combined with clinical observations, patient history, and epidemiological information. The expected result is Negative.  Fact Sheet for Patients:  BloggerCourse.com  Fact Sheet for Healthcare Providers:  SeriousBroker.it  This test is no t yet approved or cleared by the [place] FDA and  has been authorized for detection and/or diagnosis of SARS-CoV-2 by FDA under an Emergency Use Authorization (EUA). This EUA will remain  in effect (meaning this test can be used) for the duration of the COVID-19 declaration under Section 564(b)(1) of the Act, 21 U.S.C.section 360bbb-3(b)(1), unless the authorization is terminated  or revoked sooner.       Influenza A by PCR NEGATIVE NEGATIVE Final   Influenza B by PCR NEGATIVE NEGATIVE Final    Comment: (NOTE) The Xpert Xpress SARS-CoV-2/FLU/RSV plus assay is intended as an aid in the diagnosis of influenza from Nasopharyngeal swab specimens and should not be used as a sole basis for treatment. Nasal washings and aspirates are unacceptable for Xpert Xpress SARS-CoV-2/FLU/RSV testing.  Fact Sheet for Patients: BloggerCourse.com  Fact Sheet for Healthcare Providers: SeriousBroker.it  This test is not yet approved or cleared by the [place] FDA and has been authorized for detection and/or diagnosis of  SARS-CoV-2 by FDA under an Emergency Use Authorization (EUA). This EUA will remain in effect (meaning this test can be used) for the duration of the COVID-19 declaration under Section 564(b)(1) of the Act, 21 U.S.C. section 360bbb-3(b)(1), unless the authorization is terminated or revoked.  Performed at [facility], [address] [place], [place] [address]   Culture, blood (Routine X 2) w Reflex to ID Panel     Status: None (Preliminary result)   Collection Time: 02/14/22  4:23 PM   Specimen: BLOOD  Result Value Ref Range Status   Specimen Description   Final    BLOOD BLOOD LEFT HAND Performed at [facility], [address], [place], [place] [address]    Special Requests   Final    BOTTLES DRAWN AEROBIC ONLY Blood Culture adequate volume Performed at [facility], [address], [place], [place] [address]    Culture   Final    NO GROWTH < 24 HOURS Performed at [facility], [address], [place], [place] [address]    Report Status PENDING  Incomplete  Culture, blood (Routine X 2) w Reflex to ID Panel     Status: None (Preliminary result)   Collection Time: 02/14/22  4:23 PM   Specimen: BLOOD  Result Value Ref Range Status   Specimen Description   Final    BLOOD BLOOD RIGHT HAND Performed at [facility], [address], [place], [place] [address]    Special Requests   Final    BOTTLES DRAWN AEROBIC ONLY Blood Culture adequate volume Performed at [facility], [address], [place], [place] [address]    Culture   Final    NO GROWTH < 24 HOURS Performed at [facility], [address], [place], [place] [address]    Report Status PENDING  Incomplete  MRSA Next Gen by PCR, Nasal     Status: Abnormal   Collection  Time: 02/16/22  8:50 AM   Specimen: Nasal Mucosa; Nasal Swab  Result Value Ref Range Status   MRSA by PCR Next Gen DETECTED (A) NOT DETECTED Final     Comment: (NOTE) The GeneXpert MRSA Assay (FDA approved for NASAL specimens only), is one component of a comprehensive MRSA colonization surveillance program. It is not intended to diagnose MRSA infection nor to guide or monitor treatment for MRSA infections. Test performance is not FDA approved in patients less than 47 years old. Performed at [facility], [address], [place], [place] [address]          Radiology Studies: ECHOCARDIOGRAM COMPLETE  Result Date: 02/15/2022    ECHOCARDIOGRAM REPORT   Patient Name:   FIANNA SNOWBALL Date of Exam: 02/15/2022 Medical Rec #:  [id number]        Height:       60.2 in Accession #:    [id number]       Weight:       137.0 lb Date of Birth:  1971-07-19        BSA:          1.594 m Patient Age:    51 years         BP:           127/82 mmHg Patient Gender: F                HR:           96 bpm. Exam Location:  Inpatient Procedure: 2D Echo, Cardiac Doppler and Color Doppler Indications:    Bacteremia  History:        Patient has no prior history of Echocardiogram examinations.                 Risk Factors:Hypertension and Diabetes.  Sonographer:    Cleatis Polka Referring Phys: [id number] [facility] IMPRESSIONS  1. Left ventricular ejection fraction, by estimation, is 65 to 70%. The left ventricle has normal function. The left ventricle has no regional wall motion abnormalities. Left ventricular diastolic parameters were normal.  2. Right ventricular systolic function is normal. The right ventricular size is normal.  3. The mitral valve is grossly normal. No evidence of mitral valve regurgitation. No evidence of mitral stenosis.  4. The aortic valve was not well visualized. Aortic valve regurgitation is not visualized. No aortic stenosis is present.  5. The inferior vena cava is normal in size with <50% respiratory variability, suggesting right atrial pressure of 8 mmHg. Comparison(s): No prior Echocardiogram. Conclusion(s)/Recommendation(s):  Normal biventricular function without evidence of hemodynamically significant valvular heart disease. No evidence of valvular vegetations on this transthoracic echocardiogram. Consider a transesophageal echocardiogram to exclude infective endocarditis if clinically indicated. FINDINGS  Left Ventricle: Left ventricular ejection fraction, by estimation, is 65 to 70%. The left ventricle has normal function. The left ventricle has no regional wall motion abnormalities. The left ventricular internal cavity size was normal in size. There is  no left ventricular hypertrophy. Left ventricular diastolic parameters were normal. Right Ventricle: The right ventricular size is normal. No increase in right ventricular wall thickness. Right ventricular systolic function is normal. Left Atrium: Left atrial size was normal in size. Right Atrium: Right atrial size was normal in size. Pericardium: Trivial pericardial effusion is present. Mitral Valve: The mitral valve is grossly normal. No evidence of mitral valve regurgitation. No evidence of mitral valve stenosis. Tricuspid Valve: The tricuspid valve is not well visualized. Tricuspid valve regurgitation is not  demonstrated. No evidence of tricuspid stenosis. Aortic Valve: The aortic valve was not well visualized. Aortic valve regurgitation is not visualized. No aortic stenosis is present. Aortic valve peak gradient measures 11.6 mmHg. Pulmonic Valve: The pulmonic valve was not well visualized. Pulmonic valve regurgitation is not visualized. Aorta: The aortic root, ascending aorta, aortic arch and descending aorta are all structurally normal, with no evidence of dilitation or obstruction. Venous: The inferior vena cava is normal in size with less than 50% respiratory variability, suggesting right atrial pressure of 8 mmHg. IAS/Shunts: The atrial septum is grossly normal.  LEFT VENTRICLE PLAX 2D LVIDd:         3.60 cm     Diastology LVIDs:         2.30 cm     LV e' medial:    8.05  cm/s LV PW:         1.00 cm     LV E/e' medial:  13.4 LV IVS:        0.60 cm     LV e' lateral:   8.38 cm/s LVOT diam:     2.00 cm     LV E/e' lateral: 12.9 LV SV:         70 LV SV Index:   44 LVOT Area:     3.14 cm  LV Volumes (MOD) LV vol d, MOD A2C: 68.8 ml LV vol d, MOD A4C: 77.8 ml LV vol s, MOD A2C: 24.2 ml LV vol s, MOD A4C: 26.4 ml LV SV MOD A2C:     44.6 ml LV SV MOD A4C:     77.8 ml LV SV MOD BP:      48.9 ml RIGHT VENTRICLE             IVC RV Basal diam:  2.00 cm     IVC diam: 1.60 cm RV Mid diam:    1.90 cm RV S prime:     14.70 cm/s TAPSE (M-mode): 2.5 cm LEFT ATRIUM             Index        RIGHT ATRIUM          Index LA diam:        2.90 cm 1.82 cm/m   RA Area:     8.99 cm LA Vol (A2C):   24.2 ml 15.18 ml/m  RA Volume:   13.10 ml 8.22 ml/m LA Vol (A4C):   23.7 ml 14.87 ml/m LA Biplane Vol: 25.1 ml 15.75 ml/m  AORTIC VALVE AV Area (Vmax): 2.16 cm AV Vmax:        170.00 cm/s AV Peak Grad:   11.6 mmHg LVOT Vmax:      117.00 cm/s LVOT Vmean:     85.500 cm/s LVOT VTI:       0.223 m  AORTA Ao Root diam: 2.60 cm Ao Asc diam:  2.70 cm MITRAL VALVE MV Area (PHT): 5.27 cm     SHUNTS MV Decel Time: 144 msec     Systemic VTI:  0.22 m MV E velocity: 108.00 cm/s  Systemic Diam: 2.00 cm MV A velocity: 84.50 cm/s MV E/A ratio:  1.28 Jodelle Red MD Electronically signed by Jodelle Red MD Signature Date/Time: 02/15/2022/7:27:37 PM    Final         Scheduled Meds:  darifenacin  7.5 mg Oral Daily   enoxaparin (LOVENOX) injection  40 mg Subcutaneous Q24H   Gerhardt's butt cream   Topical Daily   glipiZIDE  2.5 mg Oral Q breakfast   insulin aspart  0-15 Units Subcutaneous TID WC   iron polysaccharides  300 mg Oral Daily   linagliptin  5 mg Oral Daily   loratadine  10 mg Oral Daily   mirabegron ER  50 mg Oral Daily   mirabegron ER  50 mg Oral Once   pneumococcal 20-valent conjugate vaccine  0.5 mL Intramuscular Tomorrow-1000   Continuous Infusions:  DAPTOmycin (CUBICIN) 500 mg  in sodium chloride 0.9 % IVPB     lactated ringers 100 mL/hr at 02/16/22 0107     LOS: 3 days    Time spent: 38 min  Alwyn RenElizabeth G Gurpreet Mariani, MD 02/16/2022, 1:23 PM

## 2022-02-17 ENCOUNTER — Inpatient Hospital Stay: Payer: Self-pay

## 2022-02-17 DIAGNOSIS — B9562 Methicillin resistant Staphylococcus aureus infection as the cause of diseases classified elsewhere: Secondary | ICD-10-CM | POA: Diagnosis not present

## 2022-02-17 DIAGNOSIS — Z933 Colostomy status: Secondary | ICD-10-CM

## 2022-02-17 DIAGNOSIS — I10 Essential (primary) hypertension: Secondary | ICD-10-CM | POA: Diagnosis not present

## 2022-02-17 DIAGNOSIS — Q052 Lumbar spina bifida with hydrocephalus: Secondary | ICD-10-CM | POA: Diagnosis not present

## 2022-02-17 DIAGNOSIS — L89153 Pressure ulcer of sacral region, stage 3: Secondary | ICD-10-CM | POA: Diagnosis not present

## 2022-02-17 DIAGNOSIS — Z9359 Other cystostomy status: Secondary | ICD-10-CM

## 2022-02-17 DIAGNOSIS — R7881 Bacteremia: Secondary | ICD-10-CM | POA: Diagnosis not present

## 2022-02-17 LAB — CBC
HCT: 30.1 % — ABNORMAL LOW (ref 36.0–46.0)
Hemoglobin: 9.2 g/dL — ABNORMAL LOW (ref 12.0–15.0)
MCH: 25 pg — ABNORMAL LOW (ref 26.0–34.0)
MCHC: 30.6 g/dL (ref 30.0–36.0)
MCV: 81.8 fL (ref 80.0–100.0)
Platelets: 497 10*3/uL — ABNORMAL HIGH (ref 150–400)
RBC: 3.68 MIL/uL — ABNORMAL LOW (ref 3.87–5.11)
RDW: 18 % — ABNORMAL HIGH (ref 11.5–15.5)
WBC: 8.5 10*3/uL (ref 4.0–10.5)
nRBC: 0.2 % (ref 0.0–0.2)

## 2022-02-17 LAB — COMPREHENSIVE METABOLIC PANEL
ALT: 18 U/L (ref 0–44)
AST: 15 U/L (ref 15–41)
Albumin: 2.8 g/dL — ABNORMAL LOW (ref 3.5–5.0)
Alkaline Phosphatase: 101 U/L (ref 38–126)
Anion gap: 9 (ref 5–15)
BUN: 13 mg/dL (ref 6–20)
CO2: 25 mmol/L (ref 22–32)
Calcium: 8.5 mg/dL — ABNORMAL LOW (ref 8.9–10.3)
Chloride: 103 mmol/L (ref 98–111)
Creatinine, Ser: <0.3 mg/dL — ABNORMAL LOW (ref 0.44–1.00)
Glucose, Bld: 145 mg/dL — ABNORMAL HIGH (ref 70–99)
Potassium: 3.2 mmol/L — ABNORMAL LOW (ref 3.5–5.1)
Sodium: 137 mmol/L (ref 135–145)
Total Bilirubin: 0.2 mg/dL — ABNORMAL LOW (ref 0.3–1.2)
Total Protein: 7.5 g/dL (ref 6.5–8.1)

## 2022-02-17 LAB — GLUCOSE, CAPILLARY
Glucose-Capillary: 146 mg/dL — ABNORMAL HIGH (ref 70–99)
Glucose-Capillary: 180 mg/dL — ABNORMAL HIGH (ref 70–99)

## 2022-02-17 LAB — CK: Total CK: 8 U/L — ABNORMAL LOW (ref 38–234)

## 2022-02-17 MED ORDER — SODIUM CHLORIDE 0.9% FLUSH: 10.0000 mL | Freq: Two times a day (BID) | INTRAVENOUS | Status: DC

## 2022-02-17 MED ORDER — HEPARIN SOD (PORK) LOCK FLUSH 100 UNIT/ML IV SOLN
250.0000 [IU] | INTRAVENOUS | Status: AC | PRN
  Administered 2022-02-17: 250 [IU]

## 2022-02-17 MED ORDER — SODIUM CHLORIDE 0.9 % IV SOLN
8.0000 mg/kg | Freq: Once | INTRAVENOUS | Status: DC
  Filled 2022-02-17: qty 10

## 2022-02-17 MED ORDER — DAPTOMYCIN IV (FOR PTA / DISCHARGE USE ONLY): 500.0000 mg | INTRAVENOUS | 0 refills | Status: DC

## 2022-02-17 MED ORDER — POTASSIUM CHLORIDE CRYS ER 20 MEQ PO TBCR
40.0000 meq | EXTENDED_RELEASE_TABLET | Freq: Once | ORAL | Status: AC
Start: 2022-02-17 — End: 2022-02-17
  Administered 2022-02-17: 40 meq via ORAL
  Filled 2022-02-17: qty 2

## 2022-02-17 MED ORDER — CHLORHEXIDINE GLUCONATE CLOTH 2 % EX PADS
6.0000 | MEDICATED_PAD | Freq: Every day | CUTANEOUS | Status: DC
Start: 2022-02-17 — End: 2022-02-17

## 2022-02-17 MED ORDER — SODIUM CHLORIDE 0.9% FLUSH: 10.0000 mL | INTRAVENOUS | Status: DC | PRN

## 2022-02-17 NOTE — Progress Notes (Signed)
Peripherally Inserted Central Catheter Placement  The IV Nurse has discussed with the patient and/or persons authorized to consent for the patient, the purpose of this procedure and the potential benefits and risks involved with this procedure.  The benefits include less needle sticks, lab draws from the catheter, and the patient may be discharged home with the catheter. Risks include, but not limited to, infection, bleeding, blood clot (thrombus formation), and puncture of an artery; nerve damage and irregular heartbeat and possibility to perform a PICC exchange if needed/ordered by physician.  Alternatives to this procedure were also discussed.  Bard Power PICC patient education guide, fact sheet on infection prevention and patient information card has been provided to patient /or left at bedside.    PICC Placement Documentation  PICC Single Lumen 02/17/22 Right Brachial 37 cm 0 cm (Active)  Indication for Insertion or Continuance of Line Home intravenous therapies (PICC only) 02/17/22 1500  Exposed Catheter (cm) 0 cm 02/17/22 1500  Site Assessment Clean, Dry, Intact 02/17/22 1500  Line Status Flushed;Saline locked;Blood return noted 02/17/22 1500  Dressing Type Transparent;Securing device 02/17/22 1500  Dressing Status Antimicrobial disc in place 02/17/22 1500  Safety Lock Not Applicable 02/17/22 1500  Line Care Connections checked and tightened 02/17/22 1500  Dressing Intervention New dressing 02/17/22 1500  Dressing Change Due 02/24/22 02/17/22 1500       Franne Grip Renee 02/17/2022, 3:11 PM

## 2022-02-17 NOTE — Progress Notes (Signed)
PHARMACY CONSULT NOTE FOR:  Daptomycin  OUTPATIENT  PARENTERAL ANTIBIOTIC THERAPY (OPAT)  Indication: MRSA bacteremia Regimen: Daptomycin 500mg  IV q24h End date: 03/27/2022  IV antibiotic discharge orders are pended. To discharging provider:  please sign these orders via discharge navigator,  Select New Orders & click on the button choice - Manage This Unsigned Work.     Thank you for allowing pharmacy to be a part of this patient's care.  03/29/2022 02/17/2022, 10:01 AM

## 2022-02-17 NOTE — Plan of Care (Signed)

## 2022-02-17 NOTE — Progress Notes (Signed)
Subjective: No new complaints   Antibiotics:  Anti-infectives (From admission, onward)    Start     Dose/Rate Route Frequency Ordered Stop   02/17/22 0000  daptomycin (CUBICIN) IVPB        500 mg Intravenous Every 24 hours 02/17/22 1030 03/27/22 2359   02/16/22 2000  DAPTOmycin (CUBICIN) 500 mg in sodium chloride 0.9 % IVPB        8 mg/kg  62.1 kg 120 mL/hr over 30 Minutes Intravenous Daily 02/16/22 1056     02/13/22 2100  Vancomycin (VANCOCIN) 1,250 mg in sodium chloride 0.9 % 250 mL IVPB  Status:  Discontinued        1,250 mg 166.7 mL/hr over 90 Minutes Intravenous Every 24 hours 02/13/22 1453 02/13/22 1717   02/13/22 2000  vancomycin (VANCOREADY) IVPB 1250 mg/250 mL  Status:  Discontinued        1,250 mg 166.7 mL/hr over 90 Minutes Intravenous Every 24 hours 02/13/22 1722 02/16/22 1056   02/13/22 1830  ceFEPIme (MAXIPIME) 2 g in sodium chloride 0.9 % 100 mL IVPB  Status:  Discontinued        2 g 200 mL/hr over 30 Minutes Intravenous Every 8 hours 02/13/22 1714 02/16/22 0800   02/12/22 2100  vancomycin (VANCOCIN) IVPB 1000 mg/200 mL premix        1,000 mg 200 mL/hr over 60 Minutes Intravenous  Once 02/12/22 2053 02/12/22 2300   02/12/22 1745  cefTRIAXone (ROCEPHIN) 2 g in sodium chloride 0.9 % 100 mL IVPB  Status:  Discontinued        2 g 200 mL/hr over 30 Minutes Intravenous Every 24 hours 02/12/22 1739 02/13/22 1655       Medications: Scheduled Meds:  darifenacin  7.5 mg Oral Daily   enoxaparin (LOVENOX) injection  40 mg Subcutaneous Q24H   Gerhardt's butt cream   Topical Daily   glipiZIDE  2.5 mg Oral Q breakfast   insulin aspart  0-15 Units Subcutaneous TID WC   iron polysaccharides  300 mg Oral Daily   linagliptin  5 mg Oral Daily   loratadine  10 mg Oral Daily   mirabegron ER  50 mg Oral Daily   mirabegron ER  50 mg Oral Once   pneumococcal 20-valent conjugate vaccine  0.5 mL Intramuscular Tomorrow-1000   potassium chloride  40 mEq Oral Once    Continuous Infusions:  DAPTOmycin (CUBICIN) 500 mg in sodium chloride 0.9 % IVPB Stopped (02/16/22 2229)   lactated ringers 100 mL/hr at 02/17/22 0907   PRN Meds:.acetaminophen    Objective: Weight change:   Intake/Output Summary (Last 24 hours) at 02/17/2022 1205 Last data filed at 02/17/2022 0800 Gross per 24 hour  Intake 1697.17 ml  Output 2650 ml  Net -952.83 ml    Blood pressure 121/73, pulse 89, temperature 98.2 F (36.8 C), temperature source Oral, resp. rate 20, height 5' 0.24" (1.53 m), weight 62.1 kg, SpO2 98 %. Temp:  [98.2 F (36.8 C)-98.9 F (37.2 C)] 98.2 F (36.8 C) (07/19 0513) Pulse Rate:  [89-97] 89 (07/19 0513) Resp:  [16-20] 20 (07/19 0513) BP: (121-133)/(73-78) 121/73 (07/19 0513) SpO2:  [97 %-98 %] 98 % (07/19 0513)  Physical Exam: Physical Exam Constitutional:      General: She is not in acute distress.    Appearance: She is well-developed. She is obese. She is not diaphoretic.  HENT:     Head: Normocephalic and atraumatic.     Right Ear:  External ear normal.     Left Ear: External ear normal.     Mouth/Throat:     Pharynx: No oropharyngeal exudate.  Eyes:     General: No scleral icterus.    Conjunctiva/sclera: Conjunctivae normal.     Pupils: Pupils are equal, round, and reactive to light.  Cardiovascular:     Rate and Rhythm: Normal rate and regular rhythm.  Pulmonary:     Effort: Pulmonary effort is normal. No respiratory distress.     Breath sounds: Normal breath sounds. No wheezing.  Abdominal:     General: There is no distension.     Palpations: Abdomen is soft.  Musculoskeletal:        General: No tenderness.  Lymphadenopathy:     Cervical: No cervical adenopathy.  Skin:    General: Skin is warm and dry.     Coloration: Skin is not pale.     Findings: Erythema present. No rash.  Neurological:     Mental Status: She is oriented to person, place, and time.     Motor: No abnormal muscle tone.     Coordination: Coordination  abnormal.  Psychiatric:        Mood and Affect: Mood normal.        Behavior: Behavior normal.        Thought Content: Thought content normal.        Judgment: Judgment normal.     Erythema of LLE is improved  CBC:    BMET Recent Labs    02/16/22 1353 02/17/22 0749  NA 140 137  K 3.7 3.2*  CL 107 103  CO2 23 25  GLUCOSE 258* 145*  BUN 13 13  CREATININE 0.35* <0.30*  CALCIUM 8.9 8.5*      Liver Panel  Recent Labs    02/16/22 1353 02/17/22 0749  PROT 7.4 7.5  ALBUMIN 2.9* 2.8*  AST 17 15  ALT 18 18  ALKPHOS 101 101  BILITOT 0.3 0.2*        Sedimentation Rate No results for input(s): "ESRSEDRATE" in the last 72 hours. C-Reactive Protein No results for input(s): "CRP" in the last 72 hours.  Micro Results: Recent Results (from the past 720 hour(s))  Urine Culture     Status: Abnormal   Collection Time: 02/12/22  5:30 PM   Specimen: In/Out Cath Urine  Result Value Ref Range Status   Specimen Description   Final    IN/OUT CATH URINE Performed at [facility], [address] [place], [place] [address]    Special Requests   Final    NONE Performed at [facility], [address] [place], [place] [address]    Culture MULTIPLE SPECIES PRESENT, SUGGEST RECOLLECTION (A)  Final   Report Status 02/14/2022 FINAL  Final  Blood Culture (routine x 2)     Status: Abnormal   Collection Time: 02/12/22  5:58 PM   Specimen: BLOOD  Result Value Ref Range Status   Specimen Description   Final    BLOOD RIGHT ANTECUBITAL Performed at [facility], [place] [address] [place], [place] [address]    Special Requests   Final    Blood Culture adequate volume BOTTLES DRAWN AEROBIC AND ANAEROBIC Performed at [facility], [address] [place], [place] [address]    Culture  Setup Time   Final    GRAM POSITIVE COCCI IN CLUSTERS IN BOTH AEROBIC AND ANAEROBIC BOTTLES CRITICAL RESULT CALLED TO,  READ BACK BY AND VERIFIED WITH: RN  S.JOUGE AT 3716 ON 02/13/2022 BY T.SAAD. Performed at [facility], [place] [address] [place], [place] [address]    Culture METHICILLIN RESISTANT STAPHYLOCOCCUS AUREUS (A)  Final   Report Status 02/15/2022 FINAL  Final   Organism ID, Bacteria METHICILLIN RESISTANT STAPHYLOCOCCUS AUREUS  Final      Susceptibility   Methicillin resistant staphylococcus aureus - MIC*    CIPROFLOXACIN >=8 RESISTANT Resistant     ERYTHROMYCIN >=8 RESISTANT Resistant     GENTAMICIN <=0.5 SENSITIVE Sensitive     OXACILLIN >=4 RESISTANT Resistant     TETRACYCLINE <=1 SENSITIVE Sensitive     VANCOMYCIN 1 SENSITIVE Sensitive     TRIMETH/SULFA <=10 SENSITIVE Sensitive     CLINDAMYCIN <=0.25 SENSITIVE Sensitive     RIFAMPIN <=0.5 SENSITIVE Sensitive     Inducible Clindamycin NEGATIVE Sensitive     * METHICILLIN RESISTANT STAPHYLOCOCCUS AUREUS  Blood Culture ID Panel (Reflexed)     Status: Abnormal   Collection Time: 02/12/22  5:58 PM  Result Value Ref Range Status   Enterococcus faecalis NOT DETECTED NOT DETECTED Final   Enterococcus Faecium NOT DETECTED NOT DETECTED Final   Listeria monocytogenes NOT DETECTED NOT DETECTED Final   Staphylococcus species DETECTED (A) NOT DETECTED Final    Comment: CRITICAL RESULT CALLED TO, READ BACK BY AND VERIFIED WITH: RN S.JOUGE AT 3810 ON 02/13/2022 BY T.SAAD.    Staphylococcus aureus (BCID) DETECTED (A) NOT DETECTED Final    Comment: Methicillin (oxacillin)-resistant Staphylococcus aureus (MRSA). MRSA is predictably resistant to beta-lactam antibiotics (except ceftaroline). Preferred therapy is vancomycin unless clinically contraindicated. Patient requires contact precautions if  hospitalized. CRITICAL RESULT CALLED TO, READ BACK BY AND VERIFIED WITH: RN S.JOUGE AT 1751 ON 02/13/2022 BY T.SAAD.    Staphylococcus epidermidis NOT DETECTED NOT DETECTED Final   Staphylococcus lugdunensis NOT DETECTED NOT DETECTED Final   Streptococcus species NOT DETECTED NOT DETECTED  Final   Streptococcus agalactiae NOT DETECTED NOT DETECTED Final   Streptococcus pneumoniae NOT DETECTED NOT DETECTED Final   Streptococcus pyogenes NOT DETECTED NOT DETECTED Final   A.calcoaceticus-baumannii NOT DETECTED NOT DETECTED Final   Bacteroides fragilis NOT DETECTED NOT DETECTED Final   Enterobacterales NOT DETECTED NOT DETECTED Final   Enterobacter cloacae complex NOT DETECTED NOT DETECTED Final   Escherichia coli NOT DETECTED NOT DETECTED Final   Klebsiella aerogenes NOT DETECTED NOT DETECTED Final   Klebsiella oxytoca NOT DETECTED NOT DETECTED Final   Klebsiella pneumoniae NOT DETECTED NOT DETECTED Final   Proteus species NOT DETECTED NOT DETECTED Final   Salmonella species NOT DETECTED NOT DETECTED Final   Serratia marcescens NOT DETECTED NOT DETECTED Final   Haemophilus influenzae NOT DETECTED NOT DETECTED Final   Neisseria meningitidis NOT DETECTED NOT DETECTED Final   Pseudomonas aeruginosa NOT DETECTED NOT DETECTED Final   Stenotrophomonas maltophilia NOT DETECTED NOT DETECTED Final   Candida albicans NOT DETECTED NOT DETECTED Final   Candida auris NOT DETECTED NOT DETECTED Final   Candida glabrata NOT DETECTED NOT DETECTED Final   Candida krusei NOT DETECTED NOT DETECTED Final   Candida parapsilosis NOT DETECTED NOT DETECTED Final   Candida tropicalis NOT DETECTED NOT DETECTED Final   Cryptococcus neoformans/gattii NOT DETECTED NOT DETECTED Final   Meth resistant mecA/C and MREJ DETECTED (A) NOT DETECTED Final    Comment: CRITICAL RESULT CALLED TO, READ BACK BY AND VERIFIED WITH: RN S.JOUGE AT 0258 ON 02/13/2022 BY T.SAAD. Performed at [facility], [place]  [address] [place], [place] [address]   Blood Culture (routine x 2)     Status: Abnormal   Collection Time: 02/12/22  6:10 PM   Specimen: BLOOD  Result Value Ref Range Status   Specimen Description   Final    BLOOD LEFT ANTECUBITAL Performed at [facility], [place] [address], [place], [place]  [address]    Special Requests   Final    Blood Culture results may not be optimal due to an inadequate volume of blood received in culture bottles [place] Performed at [facility], [address] [place], [place] [address]    Culture  Setup Time   Final    GRAM POSITIVE COCCI IN CLUSTERS AEROBIC BOTTLE ONLY CRITICAL VALUE NOTED.  VALUE IS CONSISTENT WITH PREVIOUSLY REPORTED AND CALLED VALUE.    Culture (A)  Final    STAPHYLOCOCCUS CAPITIS STAPHYLOCOCCUS EPIDERMIDIS THE SIGNIFICANCE OF ISOLATING THIS ORGANISM FROM A SINGLE SET OF BLOOD CULTURES WHEN MULTIPLE SETS ARE DRAWN IS UNCERTAIN. PLEASE NOTIFY THE MICROBIOLOGY DEPARTMENT WITHIN ONE WEEK IF SPECIATION AND SENSITIVITIES ARE REQUIRED. CRITICAL RESULT CALLED TO, READ BACK BY AND VERIFIED WITH: PHARMD NICK  [phone] FCP Performed at [facility], [place] [address], [place], [place] [address]    Report Status 02/15/2022 FINAL  Final  Resp Panel by RT-PCR (Flu A&B, Covid)     Status: None   Collection Time: 02/12/22  6:19 PM   Specimen: Nasal Swab  Result Value Ref Range Status   SARS Coronavirus 2 by RT PCR NEGATIVE NEGATIVE Final    Comment: (NOTE) SARS-CoV-2 target nucleic acids are NOT DETECTED.  The SARS-CoV-2 RNA is generally detectable in upper respiratory specimens during the acute phase of infection. The lowest concentration of SARS-CoV-2 viral copies this assay can detect is 138 copies/mL. A negative result does not preclude SARS-Cov-2 infection and should not be used as the sole basis for treatment or other patient management decisions. A negative result may occur with  improper specimen collection/handling, submission of specimen other than nasopharyngeal swab, presence of viral mutation(s) within the areas targeted by this assay, and inadequate number of viral copies(<138 copies/mL). A negative result must be combined with clinical observations, patient history, and  epidemiological information. The expected result is Negative.  Fact Sheet for Patients:  EntrepreneurPulse.com.au  Fact Sheet for Healthcare Providers:  IncredibleEmployment.be  This test is no t yet approved or cleared by the [place] FDA and  has been authorized for detection and/or diagnosis of SARS-CoV-2 by FDA under an Emergency Use Authorization (EUA). This EUA will remain  in effect (meaning this test can be used) for the duration of the COVID-19 declaration under Section 564(b)(1) of the Act, 21 U.S.C.section 360bbb-3(b)(1), unless the authorization is terminated  or revoked sooner.       Influenza A by PCR NEGATIVE NEGATIVE Final   Influenza B by PCR NEGATIVE NEGATIVE Final    Comment: (NOTE) The Xpert Xpress SARS-CoV-2/FLU/RSV plus assay is intended as an aid in the diagnosis of influenza from Nasopharyngeal swab specimens and should not be used as a sole basis for treatment. Nasal washings and aspirates are unacceptable for Xpert Xpress SARS-CoV-2/FLU/RSV testing.  Fact Sheet for Patients: EntrepreneurPulse.com.au  Fact Sheet for Healthcare Providers: IncredibleEmployment.be  This test is not yet approved or cleared by the [place] FDA and has been authorized for detection and/or diagnosis of SARS-CoV-2 by FDA under an Emergency Use Authorization (EUA). This  EUA will remain in effect (meaning this test can be used) for the duration of the COVID-19 declaration under Section 564(b)(1) of the Act, 21 U.S.C. section 360bbb-3(b)(1), unless the authorization is terminated or revoked.  Performed at [facility], [address] [place], [place] [address]   Culture, blood (Routine X 2) w Reflex to ID Panel     Status: None (Preliminary result)   Collection Time: 02/14/22  4:23 PM   Specimen: BLOOD  Result Value Ref Range Status   Specimen Description   Final    BLOOD BLOOD  LEFT HAND Performed at [facility] [address], [place], [place] [address]    Special Requests   Final    BOTTLES DRAWN AEROBIC ONLY Blood Culture adequate volume Performed at [facility] [address], [place], [place] [address]    Culture   Final    NO GROWTH 3 DAYS Performed at [facility], [place] [address], [place], [place] [address]    Report Status PENDING  Incomplete  Culture, blood (Routine X 2) w Reflex to ID Panel     Status: None (Preliminary result)   Collection Time: 02/14/22  4:23 PM   Specimen: BLOOD  Result Value Ref Range Status   Specimen Description   Final    BLOOD BLOOD RIGHT HAND Performed at [facility] [address] [place], [place] [address]    Special Requests   Final    BOTTLES DRAWN AEROBIC ONLY Blood Culture adequate volume Performed at [facility] [address], [place], [place] [address]    Culture   Final    NO GROWTH 3 DAYS Performed at [facility], [place] [address], [place], [place] [address]    Report Status PENDING  Incomplete  MRSA Next Gen by PCR, Nasal     Status: Abnormal   Collection Time: 02/16/22  8:50 AM   Specimen: Nasal Mucosa; Nasal Swab  Result Value Ref Range Status   MRSA by PCR Next Gen DETECTED (A) NOT DETECTED Final    Comment: (NOTE) The GeneXpert MRSA Assay (FDA approved for NASAL specimens only), is one component of a comprehensive MRSA colonization surveillance program. It is not intended to diagnose MRSA infection nor to guide or monitor treatment for MRSA infections. Test performance is not FDA approved in patients less than 16 years old. Performed at [facility], [place] [address] [place],  [address]     Studies/Results: [place] EKG SITE RITE  Result Date: 02/17/2022 If Site Rite image not attached, placement could not be confirmed due to current cardiac rhythm.  ECHOCARDIOGRAM  COMPLETE  Result Date: 02/15/2022    ECHOCARDIOGRAM REPORT   Patient Name:   MASSIE COGLIANO Date of Exam: 02/15/2022 Medical Rec #:  [id number]        Height:       60.2 in Accession #:    [id number]       Weight:       137.0 lb Date of Birth:  Jan 13, 1971        BSA:          1.594 m Patient Age:    51 years         BP:           127/82 mmHg Patient Gender: F                HR:  96 bpm. Exam Location:  Inpatient Procedure: 2D Echo, Cardiac Doppler and Color Doppler Indications:    Bacteremia  History:        Patient has no prior history of Echocardiogram examinations.                 Risk Factors:Hypertension and Diabetes.  Sonographer:    Jyl Heinz Referring Phys: [id number] [place]  1. Left ventricular ejection fraction, by estimation, is 65 to 70%. The left ventricle has normal function. The left ventricle has no regional wall motion abnormalities. Left ventricular diastolic parameters were normal.  2. Right ventricular systolic function is normal. The right ventricular size is normal.  3. The mitral valve is grossly normal. No evidence of mitral valve regurgitation. No evidence of mitral stenosis.  4. The aortic valve was not well visualized. Aortic valve regurgitation is not visualized. No aortic stenosis is present.  5. The inferior vena cava is normal in size with <50% respiratory variability, suggesting right atrial pressure of 8 mmHg. Comparison(s): No prior Echocardiogram. Conclusion(s)/Recommendation(s): Normal biventricular function without evidence of hemodynamically significant valvular heart disease. No evidence of valvular vegetations on this transthoracic echocardiogram. Consider a transesophageal echocardiogram to exclude infective endocarditis if clinically indicated. FINDINGS  Left Ventricle: Left ventricular ejection fraction, by estimation, is 65 to 70%. The left ventricle has normal function. The left ventricle has no regional wall motion abnormalities. The left  ventricular internal cavity size was normal in size. There is  no left ventricular hypertrophy. Left ventricular diastolic parameters were normal. Right Ventricle: The right ventricular size is normal. No increase in right ventricular wall thickness. Right ventricular systolic function is normal. Left Atrium: Left atrial size was normal in size. Right Atrium: Right atrial size was normal in size. Pericardium: Trivial pericardial effusion is present. Mitral Valve: The mitral valve is grossly normal. No evidence of mitral valve regurgitation. No evidence of mitral valve stenosis. Tricuspid Valve: The tricuspid valve is not well visualized. Tricuspid valve regurgitation is not demonstrated. No evidence of tricuspid stenosis. Aortic Valve: The aortic valve was not well visualized. Aortic valve regurgitation is not visualized. No aortic stenosis is present. Aortic valve peak gradient measures 11.6 mmHg. Pulmonic Valve: The pulmonic valve was not well visualized. Pulmonic valve regurgitation is not visualized. Aorta: The aortic root, ascending aorta, aortic arch and descending aorta are all structurally normal, with no evidence of dilitation or obstruction. Venous: The inferior vena cava is normal in size with less than 50% respiratory variability, suggesting right atrial pressure of 8 mmHg. IAS/Shunts: The atrial septum is grossly normal.  LEFT VENTRICLE PLAX 2D LVIDd:         3.60 cm     Diastology LVIDs:         2.30 cm     LV e' medial:    8.05 cm/s LV PW:         1.00 cm     LV E/e' medial:  13.4 LV IVS:        0.60 cm     LV e' lateral:   8.38 cm/s LVOT diam:     2.00 cm     LV E/e' lateral: 12.9 LV SV:         70 LV SV Index:   44 LVOT Area:     3.14 cm  LV Volumes (MOD) LV vol d, MOD A2C: 68.8 ml LV vol d, MOD A4C: 77.8 ml LV vol s, MOD A2C: 24.2 ml LV vol s, MOD  A4C: 26.4 ml LV SV MOD A2C:     44.6 ml LV SV MOD A4C:     77.8 ml LV SV MOD BP:      48.9 ml RIGHT VENTRICLE             IVC RV Basal diam:  2.00 cm      IVC diam: 1.60 cm RV Mid diam:    1.90 cm RV S prime:     14.70 cm/s TAPSE (M-mode): 2.5 cm LEFT ATRIUM             Index        RIGHT ATRIUM          Index LA diam:        2.90 cm 1.82 cm/m   RA Area:     8.99 cm LA Vol (A2C):   24.2 ml 15.18 ml/m  RA Volume:   13.10 ml 8.22 ml/m LA Vol (A4C):   23.7 ml 14.87 ml/m LA Biplane Vol: 25.1 ml 15.75 ml/m  AORTIC VALVE AV Area (Vmax): 2.16 cm AV Vmax:        170.00 cm/s AV Peak Grad:   11.6 mmHg LVOT Vmax:      117.00 cm/s LVOT Vmean:     85.500 cm/s LVOT VTI:       0.223 m  AORTA Ao Root diam: 2.60 cm Ao Asc diam:  2.70 cm MITRAL VALVE MV Area (PHT): 5.27 cm     SHUNTS MV Decel Time: 144 msec     Systemic VTI:  0.22 m MV E velocity: 108.00 cm/s  Systemic Diam: 2.00 cm MV A velocity: 84.50 cm/s MV E/A ratio:  1.28 Jodelle Red MD Electronically signed by Jodelle Red MD Signature Date/Time: 02/15/2022/7:27:37 PM    Final       Assessment/Plan:  INTERVAL HISTORY:   Repeat blood cultures remain negative   Principal Problem:   MRSA bacteremia Active Problems:   Sepsis (HCC)   Suprapubic catheter (HCC)   Essential hypertension   Morbid obesity with BMI of 45.0-49.9, adult (HCC)   Type 2 diabetes mellitus with hyperglycemia, without long-term current use of insulin (HCC)   Cellulitis of left leg   Sacral decubitus ulcer   Colostomy in place [facility])   Spina bifida with hydrocephalus (HCC)    April Zervas is a 51 y.o. female with spina bifida, decubitus ulcers, sp multiple I&D in January 2023 with OR Cx+ PsA and corynebacterium and split thickness graft on 08/25/21(Followed by [facility] ID) treated with daptomycin +cefepime x 6 weeks EOT 10/02/21 now admitted with MRSA bacteremia.  #1 MRSA bacteremia:  Source could be LE area that was cellulitic, or decubitus ulcers though they are apparently better  TTE clean  One could pursue TEE  I would personally err on the side of just giving her 6 weeks of parenteral antibiotics  I  am narrowed her to  daptomycin and doing away with pseudmonal coverage since she has not had documented recent active infection with this organism.   Blood cultures taken 2 days after her initial positive blood cultures now no growth at 3 days I am comfortable placing PICC and having her discharge when home health can be arranged.   Diagnosis: MRSA bacteremia  Culture Result: MRSA  Allergies  Allergen Reactions   Ciprofloxacin Anaphylaxis   Nitrofurantoin Swelling and Other (See Comments)    Face became swollen and the throat started to swell (Benadryl needed)   Tape Swelling and Other (See Comments)  Facial swelling/redness-pt denies swelling but does state all tape causes redness    Wound Dressing Adhesive Swelling and Other (See Comments)    Facial swelling and redness   Bacitracin Other (See Comments)    Redness    Losartan Other (See Comments)    GI upset    Latex Rash and Other (See Comments)    "Precautions" (?)   Neosporin Original [Neomycin-Bacitracin Zn-Polymyx] Rash and Other (See Comments)    Redness, also    OPAT Orders Discharge antibiotics to be given via PICC line Discharge antibiotics: Per pharmacy protocol daptomycin 500mg  IV q24h  Duration: 6 weeks  End Date:  03/27/2022  [facility] Care Per Protocol:  Home health RN for IV administration and teaching; PICC line care and labs.    Labs weekly while on IV antibiotics: _x_ CBC with differential _x_ BMP  _x_ CRP __x ESR  _x_ CK  x__ Please pull PIC at completion of IV antibiotics __ Please leave PIC in place until doctor has seen patient or been notified  Fax weekly labs to [phone]  Clinic Follow Up Appt:   Emiline Mancebo has an appointment on 03/22/2022 at 245 PM with Dr. Tommy Medal at  [facility] for Infectious Disease is located in the [facility] which  is located at  [address] in [place].  [address], which is located to the left of the  elevators.  Phone: [phone]  Fax: [phone]  [email or web address]  She should arrive 15 to 30 minutes prior to her appointment.  I spent 36  minutes with the patient including than 50% of the time in face to face counseling of the patient MRSA along with review of medical records in preparation for the visit and during the visit and in coordination of her care.       LOS: 4 days   Alcide Evener 02/17/2022, 12:05 PM

## 2022-02-17 NOTE — TOC Transition Note (Signed)
Transition of Care [facility]) - CM/SW Discharge Note   Patient Details  Name: April Cabrera MRN: [id number] Date of Birth: 07-Feb-1971  Transition of Care (TOC) CM/SW Contact:  Armanda Heritage, RN Phone Number: 02/17/2022, 3:50 PM   Clinical Narrative:    Patient set up for home IV antibiotics with [facility]. [facility] to provide [facility] services.  CM confirmed with [facility]/ [facility] that everything is arranged.  Noted PICC  line has been placed, PICC line report faxed to [facility].    Final next level of care: Home w Home Health Services Barriers to Discharge: Other (must enter comment) (needs picc line)   Patient Goals and CMS Choice Patient states their goals for this hospitalization and ongoing recovery are:: to go home CMS Medicare.gov Compare Post Acute Care list provided to:: Patient Choice offered to / list presented to : Patient  Discharge Placement                       Discharge Plan and Services   Discharge Planning Services: CM Consult Post Acute Care Choice: Home Health                    HH Arranged: RN, IV Antibiotics HH Agency: [facility] Date [facility] Agency Contacted: 02/17/22 Time HH Agency Contacted: [id number] Representative spoke with at [facility] Agency: Morrie Sheldon  Social Determinants of Health (SDOH) Interventions     Readmission Risk Interventions     No data to display

## 2022-02-17 NOTE — Discharge Summary (Signed)
PatientPhysician Discharge Summary  April Cabrera HAL:[id number] DOB: 1971/06/13 DOA: 02/12/2022  PCP: Lilian Coma., MD  Admit date: 02/12/2022 Discharge date: 02/17/2022 30 Day Unplanned Readmission Risk Score    Flowsheet Row ED to Hosp-Admission (Current) from 02/12/2022 in [facility]  30 Day Unplanned Readmission Risk Score (%) 7.96 Filed at 02/17/2022 0801       This score is the patient's risk of an unplanned readmission within 30 days of being discharged (0 -100%). The score is based on dignosis, age, lab data, medications, orders, and past utilization.   Low:  0-14.9   Medium: 15-21.9   High: 22-29.9   Extreme: 30 and above          Admitted From: Home Disposition: Home  Recommendations for Outpatient Follow-up:  Follow up with PCP in 1-2 weeks Please obtain BMP/CBC in one week Follow-up with ID per their scheduled appointment time Please follow up with your PCP on the following pending results: Unresulted Labs (From admission, onward)     Start     Ordered   02/17/22 0600  CK  Weekly,   R     Question:  Specimen collection method  Answer:  Lab=Lab collect   02/16/22 [place]: Yes Equipment/Devices: None  Discharge Condition: Stable CODE STATUS: Full code Diet recommendation: Cardiac  Subjective: Seen and examined.  She has no complaints.  Sister at the bedside.  Eager to go home today.  Brief/Interim Summary: 51 year old female with history of paraplegia and neurogenic bladder with suprapubic catheter chronic sacral decubitus ulcer spina bifid lives at home with her mother admitted with hematuria.  Patient had recent hospitalization for osteomyelitis of the pelvis.  She has a history significant for diabetes and hypertension.  Patient was also treated with daptomycin and cefepime in January 2023. Patient was brought to the ED by family due to gross hematuria.  Patient has a suprapubic catheter  changed every 2 weeks.  There were no clots observed.  She had some suprapubic pain and tenderness.  No fever chills reported by the family. She received vancomycin and Rocephin in the ER.  She was also thought to be having some left lower extremity cellulitis.  Admitted under hospitalist service.  Further hospitalization detailed as below.  #1 mrsa bacteremia /left lower extremity cellulitis Repeat blood cultures negative from 02/14/2022. On vancomycin and cefepime until yesterday and cefepime was discontinued by ID on 02/17/2022. Echocardiogram-02/15/2022 EF 65 to 70%.  Left ventricular normal function.  No regional wall motion abnormalities.  No evidence of mitral valve or aortic valve vegetation. Urine culture with multiple species. Leukocytosis improved to 9.0 from 15.1.  Patient with history of osteomyelitis of the pelvis.  ID has now cleared the patient for discharge.  They are planning to discharge her on daptomycin until 03/27/2022.  We will get PICC line placed and then discharge patient today.   #2 hypokalemia: 3.2.  Will replace before discharge.   #3 chronic anemia no evidence of active bleeding.  Had some gross hematuria on admission which has since resolved.  Hemoglobin stable.   #4 stage IV sacral decubitus ulcer appreciate wound care follow-up.  On vancomycin and cefepime. MRI of the sacrum and pelvis-large chronic sacral decubitus ulcer with small amount of fluid along the left margin of the ulcer base.  No well-defined or rim-enhancing fluid collection.  Surgical absence of the distal sacrum and  coccyx no evidence of acute sacral osteomyelitis.  Congenital spina bifida.  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   #5 gross hematuria in the setting of neurogenic bladder and suprapubic catheter.  Urine culture with no growth.  Her catheter was changed past Monday, 02/08/2022.   #6 history of colostomy   #7 type 2 diabetes-CBG (last 3) on  SSI Resume home medications.  Discharge plan was discussed with patient and/or family member/sister at the bedside and they verbalized understanding and agreed with it.  Discharge Diagnoses:  Principal Problem:   MRSA bacteremia Active Problems:   Sepsis ([place])   Suprapubic catheter ([place])   Essential hypertension   Morbid obesity with BMI of 45.0-49.9, adult ([place])   Type 2 diabetes mellitus with hyperglycemia, without long-term current use of insulin ([facility])   Cellulitis of left leg   Sacral decubitus ulcer   Colostomy in place [facility])   Spina bifida with hydrocephalus [facility])    Discharge Instructions  Discharge Instructions     [facility] pharmacist to adjust dose for Vancomycin, Aminoglycosides and other anti-infective therapies as requested by physician.   Complete by: As directed    [facility] to provide Cath Flo 24m   Complete by: As directed    Administer for PICC line occlusion and as ordered by physician for other access device issues.   Anaphylaxis Kit: Provided to treat any anaphylactic reaction to the medication being provided to the patient if First Dose or when requested by physician   Complete by: As directed    Epinephrine 130mml vial / amp: Administer 0.35m34m0.35ml18mubcutaneously once for moderate to severe anaphylaxis, nurse to call physician and pharmacy when reaction occurs and call 911 if needed for immediate care   Diphenhydramine 50mg20mIV vial: Administer 25-50mg 87mM PRN for first dose reaction, rash, itching, mild reaction, nurse to call physician and pharmacy when reaction occurs   Sodium Chloride 0.9% NS 500ml I4mdminister if needed for hypovolemic blood pressure drop or as ordered by physician after call to physician with anaphylactic reaction   Change dressing on IV access line weekly and PRN   Complete by: As directed    Flush IV access with Sodium Chloride 0.9% and Heparin 10 units/ml or 100 units/ml   Complete by: As directed     Home infusion instructions - [facility]   Complete by: As directed    Instructions: Flush IV access with Sodium Chloride 0.9% and Heparin 10units/ml or 100units/ml   Change dressing on IV access line: Weekly and PRN   Instructions Cath Flo 2mg: Ad18mister for PICC Line occlusion and as ordered by physician for other access device   [facility] pharmacist to adjust dose for: Vancomycin, Aminoglycosides and other anti-infective therapies as requested by physician   Method of administration may be changed at the discretion of home infusion pharmacist based upon assessment of the patient and/or caregiver's ability to self-administer the medication ordered   Complete by: As directed    Outpatient Parenteral Antibiotic Therapy Information Antibiotic: Daptomycin (Cubicin) IVPB; Indications for use: MRSA bacteremia; End Date: 03/27/2022   Complete by: As directed    Antibiotic: Daptomycin (Cubicin) IVPB   Indications for use: MRSA bacteremia   End Date: 03/27/2022      Allergies as of 02/17/2022       Reactions   Ciprofloxacin Anaphylaxis   Nitrofurantoin Swelling, Other (See Comments)   Face became swollen and the throat started to  swell (Benadryl needed)   Tape Swelling, Other (See Comments)   Facial swelling/redness-pt denies swelling but does state all tape causes redness    Wound Dressing Adhesive Swelling, Other (See Comments)   Facial swelling and redness   Bacitracin Other (See Comments)   Redness   Losartan Other (See Comments)   GI upset   Latex Rash, Other (See Comments)   "Precautions" (?)   Neosporin Original [neomycin-bacitracin Zn-polymyx] Rash, Other (See Comments)   Redness, also        Medication List     TAKE these medications    aspirin 325 MG tablet Take 325 mg by mouth in the morning.   Calmoseptine 0.44-20.6 % Oint Generic drug: Menthol-Zinc Oxide Apply 1 application  topically See admin instructions. Apply to affected areas of the  skin once a day   cetirizine 10 MG tablet Commonly known as: ZYRTEC Take 10 mg by mouth daily.   daptomycin  IVPB Commonly known as: CUBICIN Inject 500 mg into the vein daily. Indication:  MRSA bacteremia First Dose: No Last Day of Therapy:  03/27/2022 Labs - Once weekly:  CBC/D, BMP, and CPK Labs - Every other week:  ESR and CRP Method of administration: IV Push Method of administration may be changed at the discretion of home infusion pharmacist based upon assessment of the patient and/or caregiver's ability to self-administer the medication ordered.   docusate sodium 100 MG capsule Commonly known as: COLACE Take 100 mg by mouth 2 (two) times daily.   Ferrex 150 150 MG capsule Generic drug: iron polysaccharides Take 150 mg by mouth 2 (two) times daily with a meal.   glipiZIDE 2.5 MG 24 hr tablet Commonly known as: GLUCOTROL XL Take 2.5 mg by mouth daily with breakfast.   Januvia 100 MG tablet Generic drug: sitaGLIPtin Take 100 mg by mouth in the morning.   metFORMIN 500 MG tablet Commonly known as: GLUCOPHAGE Take 1,000 mg by mouth 2 (two) times daily with a meal.   Myrbetriq 50 MG Tb24 tablet Generic drug: mirabegron ER Take 50 mg by mouth daily.   nystatin ointment Commonly known as: MYCOSTATIN Apply 1 Application topically See admin instructions. Apply to sacral wound once a day, as part of wound care and an additional application to affected areas of the bends of the arms, right upper leg, and under the breasts once a day.   Remedy Phytoplex Hydraguard Crea Apply 1 application  topically See admin instructions. Apply to affected areas of the skin once a day   trospium 20 MG tablet Commonly known as: SANCTURA Take 20 mg by mouth 2 (two) times daily.   zinc sulfate 220 (50 Zn) MG capsule Take 220 mg by mouth in the morning.               Discharge Care Instructions  (From admission, onward)           Start     Ordered   02/17/22 0000  Change  dressing on IV access line weekly and PRN  (Home infusion instructions - [facility] )        02/17/22 1030            Follow-up Information     Lilian Coma., MD Follow up in 1 week(s).   Specialty: Internal Medicine Contact information: [address] [place] [address] [phone]                Allergies  Allergen Reactions  Ciprofloxacin Anaphylaxis   Nitrofurantoin Swelling and Other (See Comments)    Face became swollen and the throat started to swell (Benadryl needed)   Tape Swelling and Other (See Comments)    Facial swelling/redness-pt denies swelling but does state all tape causes redness    Wound Dressing Adhesive Swelling and Other (See Comments)    Facial swelling and redness   Bacitracin Other (See Comments)    Redness    Losartan Other (See Comments)    GI upset    Latex Rash and Other (See Comments)    "Precautions" (?)   Neosporin Original [Neomycin-Bacitracin Zn-Polymyx] Rash and Other (See Comments)    Redness, also    Consultations: ID   Procedures/Studies: [place] EKG SITE RITE  Result Date: 02/17/2022 If Site Rite image not attached, placement could not be confirmed due to current cardiac rhythm.  ECHOCARDIOGRAM COMPLETE  Result Date: 02/15/2022    ECHOCARDIOGRAM REPORT   Patient Name:   April Cabrera Date of Exam: 02/15/2022 Medical Rec #:  [id number]        Height:       60.2 in Accession #:    [id number]       Weight:       137.0 lb Date of Birth:  [birth year]        BSA:          1.594 m Patient Age:    22 years         BP:           127/82 mmHg Patient Gender: F                HR:           96 bpm. Exam Location:  Inpatient Procedure: 2D Echo, Cardiac Doppler and Color Doppler Indications:    Bacteremia  History:        Patient has no prior history of Echocardiogram examinations.                 Risk Factors:Hypertension and Diabetes.  Sonographer:    Jyl Heinz Referring Phys: [id number] Henrietta Prompton  1. Left ventricular ejection fraction, by estimation, is 65 to 70%. The left ventricle has normal function. The left ventricle has no regional wall motion abnormalities. Left ventricular diastolic parameters were normal.  2. Right ventricular systolic function is normal. The right ventricular size is normal.  3. The mitral valve is grossly normal. No evidence of mitral valve regurgitation. No evidence of mitral stenosis.  4. The aortic valve was not well visualized. Aortic valve regurgitation is not visualized. No aortic stenosis is present.  5. The inferior vena cava is normal in size with <50% respiratory variability, suggesting right atrial pressure of 8 mmHg. Comparison(s): No prior Echocardiogram. Conclusion(s)/Recommendation(s): Normal biventricular function without evidence of hemodynamically significant valvular heart disease. No evidence of valvular vegetations on this transthoracic echocardiogram. Consider a transesophageal echocardiogram to exclude infective endocarditis if clinically indicated. FINDINGS  Left Ventricle: Left ventricular ejection fraction, by estimation, is 65 to 70%. The left ventricle has normal function. The left ventricle has no regional wall motion abnormalities. The left ventricular internal cavity size was normal in size. There is  no left ventricular hypertrophy. Left ventricular diastolic parameters were normal. Right Ventricle: The right ventricular size is normal. No increase in right ventricular wall thickness. Right ventricular systolic function is normal. Left Atrium: Left atrial size was normal in size. Right Atrium: Right atrial size was normal in size. Pericardium:  Trivial pericardial effusion is present. Mitral Valve: The mitral valve is grossly normal. No evidence of mitral valve regurgitation. No evidence of mitral valve stenosis. Tricuspid Valve: The tricuspid valve is not well visualized. Tricuspid valve regurgitation is not demonstrated. No evidence of  tricuspid stenosis. Aortic Valve: The aortic valve was not well visualized. Aortic valve regurgitation is not visualized. No aortic stenosis is present. Aortic valve peak gradient measures 11.6 mmHg. Pulmonic Valve: The pulmonic valve was not well visualized. Pulmonic valve regurgitation is not visualized. Aorta: The aortic root, ascending aorta, aortic arch and descending aorta are all structurally normal, with no evidence of dilitation or obstruction. Venous: The inferior vena cava is normal in size with less than 50% respiratory variability, suggesting right atrial pressure of 8 mmHg. IAS/Shunts: The atrial septum is grossly normal.  LEFT VENTRICLE PLAX 2D LVIDd:         3.60 cm     Diastology LVIDs:         2.30 cm     LV e' medial:    8.05 cm/s LV PW:         1.00 cm     LV E/e' medial:  13.4 LV IVS:        0.60 cm     LV e' lateral:   8.38 cm/s LVOT diam:     2.00 cm     LV E/e' lateral: 12.9 LV SV:         70 LV SV Index:   44 LVOT Area:     3.14 cm  LV Volumes (MOD) LV vol d, MOD A2C: 68.8 ml LV vol d, MOD A4C: 77.8 ml LV vol s, MOD A2C: 24.2 ml LV vol s, MOD A4C: 26.4 ml LV SV MOD A2C:     44.6 ml LV SV MOD A4C:     77.8 ml LV SV MOD BP:      48.9 ml RIGHT VENTRICLE             IVC RV Basal diam:  2.00 cm     IVC diam: 1.60 cm RV Mid diam:    1.90 cm RV S prime:     14.70 cm/s TAPSE (M-mode): 2.5 cm LEFT ATRIUM             Index        RIGHT ATRIUM          Index LA diam:        2.90 cm 1.82 cm/m   RA Area:     8.99 cm LA Vol (A2C):   24.2 ml 15.18 ml/m  RA Volume:   13.10 ml 8.22 ml/m LA Vol (A4C):   23.7 ml 14.87 ml/m LA Biplane Vol: 25.1 ml 15.75 ml/m  AORTIC VALVE AV Area (Vmax): 2.16 cm AV Vmax:        170.00 cm/s AV Peak Grad:   11.6 mmHg LVOT Vmax:      117.00 cm/s LVOT Vmean:     85.500 cm/s LVOT VTI:       0.223 m  AORTA Ao Root diam: 2.60 cm Ao Asc diam:  2.70 cm MITRAL VALVE MV Area (PHT): 5.27 cm     SHUNTS MV Decel Time: 144 msec     Systemic VTI:  0.22 m MV E velocity: 108.00 cm/s   Systemic Diam: 2.00 cm MV A velocity: 84.50 cm/s MV E/A ratio:  1.28 Buford Dresser MD Electronically signed by Buford Dresser MD Signature Date/Time: 02/15/2022/7:27:37 PM  Final    MR PELVIS W WO CONTRAST  Result Date: 02/14/2022 CLINICAL DATA:  Sacral wound.  Osteomyelitis EXAM: MRI PELVIS WITHOUT AND WITH CONTRAST TECHNIQUE: Multiplanar multisequence MR imaging of the pelvis was performed both before and after administration of intravenous contrast. CONTRAST:  41m GADAVIST GADOBUTROL 1 MMOL/ML IV SOLN COMPARISON:  CT 08/19/2021 FINDINGS: Urinary Tract: Suprapubic catheter within the urinary bladder. No hydronephrosis. Bowel: Partially imaged bowel containing left lower abdominal wall hernia. No evidence of bowel obstruction or active bowel inflammation within the pelvis. Vascular/Lymphatic: Several prominent intrapelvic lymph nodes including 10 mm right external iliac chain node. No significant vascular abnormality evident by MRI. Reproductive:  No mass or other significant abnormality Other:  No free fluid within the pelvis. Musculoskeletal: Large chronic sacral decubitus ulcer. Mild postcontrast enhancement along the ulcer base. Small amount of fluid along the left margin of the ulcer base. No well-defined or rim enhancing fluid collection. There is truncation of the sacrum below the S3 level with absence of the coccyx, likely postsurgical. No bone marrow edema or enhancement to suggest acute osteomyelitis at this time. Changes of spina bifida within the lumbar spine. Bilateral hip dysplasia with moderate-advanced degenerative changes of both hips. No significant hip joint effusion. Right SI joint is partially fused. No SI joint effusion or diastasis. Diffuse fatty atrophy of the visualized musculature. IMPRESSION: 1. Large chronic sacral decubitus ulcer with small amount of fluid along the left margin of the ulcer base. No well-defined or rim enhancing fluid collection. 2. Surgical absence  of the distal sacrum and coccyx. No evidence of acute sacral osteomyelitis at this time. 3. Congenital spina bifida. 4. Bilateral hip dysplasia with moderate-advanced degenerative changes of both hips. Electronically Signed   By: NDavina PokeD.O.   On: 02/14/2022 09:18   [place]Venous Img Lower  Left (DVT Study)  Result Date: 02/12/2022 CLINICAL DATA:  Left leg edema. EXAM: LEFT LOWER EXTREMITY VENOUS DOPPLER ULTRASOUND TECHNIQUE: Gray-scale sonography with compression, as well as color and duplex ultrasound, were performed to evaluate the deep venous system(s) from the level of the common femoral vein through the popliteal and proximal calf veins. COMPARISON:  None Available. FINDINGS: VENOUS Normal compressibility of the common femoral, superficial femoral, and popliteal veins, as well as the visualized calf veins. Visualized portions of profunda femoral vein and great saphenous vein unremarkable. No filling defects to suggest DVT on grayscale or color Doppler imaging. Doppler waveforms show normal direction of venous flow, normal respiratory plasticity and response to augmentation. Limited views of the contralateral common femoral vein are unremarkable. OTHER Subcutaneous soft tissue edema is noted. Limitations: Diminished patient mobility. IMPRESSION: 1. No evidence of left lower extremity DVT. 2. Left leg soft tissue edema. Electronically Signed   By: MKeith RakeM.D.   On: 02/12/2022 19:46   DG Chest Port 1 View  Result Date: 02/12/2022 CLINICAL DATA:  Hematuria, low-grade fever, sepsis EXAM: PORTABLE CHEST 1 VIEW COMPARISON:  07/06/2017 FINDINGS: Single frontal view of the chest demonstrates an unremarkable cardiac silhouette. Shunt catheters are unchanged. Chronic elevation the right hemidiaphragm. No acute airspace disease, effusion, or pneumothorax. No acute bony abnormality. IMPRESSION: 1. Stable chest, no acute process. Electronically Signed   By: MRanda NgoM.D.   On: 02/12/2022 17:57      Discharge Exam: Vitals:   02/16/22 2040 02/17/22 0513  BP: 133/74 121/73  Pulse: 97 89  Resp: 20 20  Temp: 98.9 F (37.2 C) 98.2 F (36.8 C)  SpO2: 97% 98%  Vitals:   02/16/22 0453 02/16/22 1238 02/16/22 2040 02/17/22 0513  BP: 125/76 124/78 133/74 121/73  Pulse: 89 89 97 89  Resp: _0 Temp: 98.6 F (37 C) 98.4 F (36.9 C) 98.9 F (37.2 C) 98.2 F (36.8 C)  TempSrc: Oral Oral Oral Oral  SpO2: 96% 98% 97% 98%  Weight:      Height:        General: Pt is alert, awake, not in acute distress Cardiovascular: RRR, S1/S2 +, no rubs, no gallops Respiratory: CTA bilaterally, no wheezing, no rhonchi Abdominal: Soft, NT, ND, bowel sounds +, suprapubic catheter. Extremities: no edema, no cyanosis    The results of significant diagnostics from this hospitalization (including imaging, microbiology, ancillary and laboratory) are listed below for reference.     Microbiology: Recent Results (from the past 240 hour(s))  Urine Culture     Status: Abnormal   Collection Time: 02/12/22  5:30 PM   Specimen: In/Out Cath Urine  Result Value Ref Range Status   Specimen Description   Final    IN/OUT CATH URINE Performed at [facility], [address] [place], [place] [address]    Special Requests   Final    NONE Performed at [facility], [address] [place], [place] [address]    Culture MULTIPLE SPECIES PRESENT, SUGGEST RECOLLECTION (A)  Final   Report Status 02/14/2022 FINAL  Final  Blood Culture (routine x 2)     Status: Abnormal   Collection Time: 02/12/22  5:58 PM   Specimen: BLOOD  Result Value Ref Range Status   Specimen Description   Final    BLOOD RIGHT ANTECUBITAL Performed at [facility], [place] [address] [place], [place] [address]    Special Requests   Final    Blood Culture adequate volume BOTTLES DRAWN AEROBIC AND ANAEROBIC Performed at [facility], [address] [place], [place] [address]    Culture   Setup Time   Final    GRAM POSITIVE COCCI IN CLUSTERS IN BOTH AEROBIC AND ANAEROBIC BOTTLES CRITICAL RESULT CALLED TO, READ BACK BY AND VERIFIED WITH: RN S.JOUGE AT 1219 ON 02/13/2022 BY T.SAAD. Performed at [facility], [place] [address], [place], [place] [address]    Culture METHICILLIN RESISTANT STAPHYLOCOCCUS AUREUS (A)  Final   Report Status 02/15/2022 FINAL  Final   Organism ID, Bacteria METHICILLIN RESISTANT STAPHYLOCOCCUS AUREUS  Final      Susceptibility   Methicillin resistant staphylococcus aureus - MIC*    CIPROFLOXACIN >=8 RESISTANT Resistant     ERYTHROMYCIN >=8 RESISTANT Resistant     GENTAMICIN <=0.5 SENSITIVE Sensitive     OXACILLIN >=4 RESISTANT Resistant     TETRACYCLINE <=1 SENSITIVE Sensitive     VANCOMYCIN 1 SENSITIVE Sensitive     TRIMETH/SULFA <=10 SENSITIVE Sensitive     CLINDAMYCIN <=0.25 SENSITIVE Sensitive     RIFAMPIN <=0.5 SENSITIVE Sensitive     Inducible Clindamycin NEGATIVE Sensitive     * METHICILLIN RESISTANT STAPHYLOCOCCUS AUREUS  Blood Culture ID Panel (Reflexed)     Status: Abnormal   Collection Time: 02/12/22  5:58 PM  Result Value Ref Range Status   Enterococcus faecalis NOT DETECTED NOT DETECTED Final   Enterococcus Faecium NOT DETECTED NOT DETECTED Final   Listeria monocytogenes NOT DETECTED NOT DETECTED Final   Staphylococcus species DETECTED (A) NOT DETECTED Final    Comment: CRITICAL RESULT CALLED TO, READ BACK BY AND  VERIFIED WITH: RN S.JOUGE AT 7829 ON 02/13/2022 BY T.SAAD.    Staphylococcus aureus (BCID) DETECTED (A) NOT DETECTED Final    Comment: Methicillin (oxacillin)-resistant Staphylococcus aureus (MRSA). MRSA is predictably resistant to beta-lactam antibiotics (except ceftaroline). Preferred therapy is vancomycin unless clinically contraindicated. Patient requires contact precautions if  hospitalized. CRITICAL RESULT CALLED TO, READ BACK BY AND VERIFIED WITH: RN S.JOUGE AT 5621 ON 02/13/2022 BY T.SAAD.    Staphylococcus  epidermidis NOT DETECTED NOT DETECTED Final   Staphylococcus lugdunensis NOT DETECTED NOT DETECTED Final   Streptococcus species NOT DETECTED NOT DETECTED Final   Streptococcus agalactiae NOT DETECTED NOT DETECTED Final   Streptococcus pneumoniae NOT DETECTED NOT DETECTED Final   Streptococcus pyogenes NOT DETECTED NOT DETECTED Final   A.calcoaceticus-baumannii NOT DETECTED NOT DETECTED Final   Bacteroides fragilis NOT DETECTED NOT DETECTED Final   Enterobacterales NOT DETECTED NOT DETECTED Final   Enterobacter cloacae complex NOT DETECTED NOT DETECTED Final   Escherichia coli NOT DETECTED NOT DETECTED Final   Klebsiella aerogenes NOT DETECTED NOT DETECTED Final   Klebsiella oxytoca NOT DETECTED NOT DETECTED Final   Klebsiella pneumoniae NOT DETECTED NOT DETECTED Final   Proteus species NOT DETECTED NOT DETECTED Final   Salmonella species NOT DETECTED NOT DETECTED Final   Serratia marcescens NOT DETECTED NOT DETECTED Final   Haemophilus influenzae NOT DETECTED NOT DETECTED Final   Neisseria meningitidis NOT DETECTED NOT DETECTED Final   Pseudomonas aeruginosa NOT DETECTED NOT DETECTED Final   Stenotrophomonas maltophilia NOT DETECTED NOT DETECTED Final   Candida albicans NOT DETECTED NOT DETECTED Final   Candida auris NOT DETECTED NOT DETECTED Final   Candida glabrata NOT DETECTED NOT DETECTED Final   Candida krusei NOT DETECTED NOT DETECTED Final   Candida parapsilosis NOT DETECTED NOT DETECTED Final   Candida tropicalis NOT DETECTED NOT DETECTED Final   Cryptococcus neoformans/gattii NOT DETECTED NOT DETECTED Final   Meth resistant mecA/C and MREJ DETECTED (A) NOT DETECTED Final    Comment: CRITICAL RESULT CALLED TO, READ BACK BY AND VERIFIED WITH: RN S.JOUGE AT 3086 ON 02/13/2022 BY T.SAAD. Performed at [facility], [place] [address], [place], [place] [address]   Blood Culture (routine x 2)     Status: Abnormal   Collection Time: 02/12/22  6:10 PM   Specimen: BLOOD  Result  Value Ref Range Status   Specimen Description   Final    BLOOD LEFT ANTECUBITAL Performed at [facility], [place] [address], [place], [place] [address]    Special Requests   Final    Blood Culture results may not be optimal due to an inadequate volume of blood received in culture bottles [place] Performed at [facility], [address] [place], [place] [address]    Culture  Setup Time   Final    GRAM POSITIVE COCCI IN CLUSTERS AEROBIC BOTTLE ONLY CRITICAL VALUE NOTED.  VALUE IS CONSISTENT WITH PREVIOUSLY REPORTED AND CALLED VALUE.    Culture (A)  Final    STAPHYLOCOCCUS CAPITIS STAPHYLOCOCCUS EPIDERMIDIS THE SIGNIFICANCE OF ISOLATING THIS ORGANISM FROM A SINGLE SET OF BLOOD CULTURES WHEN MULTIPLE SETS ARE DRAWN IS UNCERTAIN. PLEASE NOTIFY THE MICROBIOLOGY DEPARTMENT WITHIN ONE WEEK IF SPECIATION AND SENSITIVITIES ARE REQUIRED. CRITICAL RESULT CALLED TO, READ BACK BY AND VERIFIED WITH: PHARMD NICK  [phone] FCP Performed at [facility], [place] [address], [place], [place] [address]    Report Status 02/15/2022 FINAL  Final  Resp  Panel by RT-PCR (Flu A&B, Covid)     Status: None   Collection Time: 02/12/22  6:19 PM   Specimen: Nasal Swab  Result Value Ref Range Status   SARS Coronavirus 2 by RT PCR NEGATIVE NEGATIVE Final    Comment: (NOTE) SARS-CoV-2 target nucleic acids are NOT DETECTED.  The SARS-CoV-2 RNA is generally detectable in upper respiratory specimens during the acute phase of infection. The lowest concentration of SARS-CoV-2 viral copies this assay can detect is 138 copies/mL. A negative result does not preclude SARS-Cov-2 infection and should not be used as the sole basis for treatment or other patient management decisions. A negative result may occur with  improper specimen collection/handling, submission of specimen other than nasopharyngeal swab, presence of viral mutation(s) within the areas targeted by this  assay, and inadequate number of viral copies(<138 copies/mL). A negative result must be combined with clinical observations, patient history, and epidemiological information. The expected result is Negative.  Fact Sheet for Patients:  EntrepreneurPulse.com.au  Fact Sheet for Healthcare Providers:  IncredibleEmployment.be  This test is no t yet approved or cleared by the [place] FDA and  has been authorized for detection and/or diagnosis of SARS-CoV-2 by FDA under an Emergency Use Authorization (EUA). This EUA will remain  in effect (meaning this test can be used) for the duration of the COVID-19 declaration under Section 564(b)(1) of the Act, 21 U.S.C.section 360bbb-3(b)(1), unless the authorization is terminated  or revoked sooner.       Influenza A by PCR NEGATIVE NEGATIVE Final   Influenza B by PCR NEGATIVE NEGATIVE Final    Comment: (NOTE) The Xpert Xpress SARS-CoV-2/FLU/RSV plus assay is intended as an aid in the diagnosis of influenza from Nasopharyngeal swab specimens and should not be used as a sole basis for treatment. Nasal washings and aspirates are unacceptable for Xpert Xpress SARS-CoV-2/FLU/RSV testing.  Fact Sheet for Patients: EntrepreneurPulse.com.au  Fact Sheet for Healthcare Providers: IncredibleEmployment.be  This test is not yet approved or cleared by the [place] FDA and has been authorized for detection and/or diagnosis of SARS-CoV-2 by FDA under an Emergency Use Authorization (EUA). This EUA will remain in effect (meaning this test can be used) for the duration of the COVID-19 declaration under Section 564(b)(1) of the Act, 21 U.S.C. section 360bbb-3(b)(1), unless the authorization is terminated or revoked.  Performed at [facility], [address] [place], [place] [address]   Culture, blood (Routine X 2) w Reflex to ID Panel     Status: None  (Preliminary result)   Collection Time: 02/14/22  4:23 PM   Specimen: BLOOD  Result Value Ref Range Status   Specimen Description   Final    BLOOD BLOOD LEFT HAND Performed at [facility] [address] [place], [place] [address]    Special Requests   Final    BOTTLES DRAWN AEROBIC ONLY Blood Culture adequate volume Performed at [facility] [address] [place], [place] [address]    Culture   Final    NO GROWTH 3 DAYS Performed at [facility], [place] [address], [place], [place] [address]    Report Status PENDING  Incomplete  Culture, blood (Routine X 2) w Reflex to ID Panel     Status: None (Preliminary result)   Collection Time: 02/14/22  4:23 PM   Specimen: BLOOD  Result Value Ref Range Status   Specimen Description   Final    BLOOD BLOOD RIGHT HAND  Performed at [facility], [place] [address], [place], [place] [address]    Special Requests   Final    BOTTLES DRAWN AEROBIC ONLY Blood Culture adequate volume Performed at [facility] [place] [address], [place], [place] [address]    Culture   Final    NO GROWTH 3 DAYS Performed at [facility], [place] [address] [place], [place] [address]    Report Status PENDING  Incomplete  MRSA Next Gen by PCR, Nasal     Status: Abnormal   Collection Time: 02/16/22  8:50 AM   Specimen: Nasal Mucosa; Nasal Swab  Result Value Ref Range Status   MRSA by PCR Next Gen DETECTED (A) NOT DETECTED Final    Comment: (NOTE) The GeneXpert MRSA Assay (FDA approved for NASAL specimens only), is one component of a comprehensive MRSA colonization surveillance program. It is not intended to diagnose MRSA infection nor to guide or monitor treatment for MRSA infections. Test performance is not FDA approved in patients less than 40 years old. Performed at [facility], [place] [address] [place], [place] [address]      Labs: BNP (last 3  results) No results for input(s): "BNP" in the last 8760 hours. Basic Metabolic Panel: Recent Labs  Lab 02/12/22 1745 02/14/22 0345 02/16/22 0440 02/16/22 1353 02/17/22 0749  NA 134* 142  --  140 137  K 3.8 3.1*  --  3.7 3.2*  CL 101 112*  --  107 103  CO2 22 22  --  23 25  GLUCOSE 189* 179*  --  258* 145*  BUN 14 13  --  13 13  CREATININE 0.43* 0.60 0.30* 0.35* <0.30*  CALCIUM 9.2 8.2*  --  8.9 8.5*  MG  --   --   --  1.8  --    Liver Function Tests: Recent Labs  Lab 02/12/22 1745 02/16/22 1353 02/17/22 0749  AST _0 ALT _1 ALKPHOS 129* 101 101  BILITOT 0.4 0.3 0.2*  PROT 8.2* 7.4 7.5  ALBUMIN 3.1* 2.9* 2.8*   No results for input(s): "LIPASE", "AMYLASE" in the last 168 hours. No results for input(s): "AMMONIA" in the last 168 hours. CBC: Recent Labs  Lab 02/12/22 1745 02/14/22 0345 02/16/22 1353 02/17/22 0749  WBC 15.1* 9.0 8.0 8.5  NEUTROABS 12.4*  --   --   --   HGB 10.6* 8.9* 8.8* 9.2*  HCT 33.2* 29.1* 29.3* 30.1*  MCV 78.7* 81.3 81.6 81.8  PLT 433* 395 456* 497*   Cardiac Enzymes: Recent Labs  Lab 02/17/22 0749  CKTOTAL 8*   BNP: Invalid input(s): "POCBNP" CBG: Recent Labs  Lab 02/16/22 0737 02/16/22 1143 02/16/22 1621 02/16/22 2042 02/17/22 0723  GLUCAP 187* 194* 223* 230* 146*   D-Dimer No results for input(s): "DDIMER" in the last 72 hours. Hgb A1c No results for input(s): "HGBA1C" in the last 72 hours. Lipid Profile No results for input(s): "CHOL", "HDL", "LDLCALC", "TRIG", "CHOLHDL", "LDLDIRECT" in the last 72 hours. Thyroid function studies No results for input(s): "TSH", "T4TOTAL", "T3FREE", "THYROIDAB" in the last 72 hours.  Invalid input(s): "FREET3" Anemia work up No results for input(s): "VITAMINB12", "FOLATE", "FERRITIN", "TIBC", "IRON", "RETICCTPCT" in the last 72 hours. Urinalysis    Component Value Date/Time   COLORURINE YELLOW 02/12/2022 1730   APPEARANCEUR CLOUDY (A) 02/12/2022 1730   LABSPEC  <=1.005 02/12/2022 1730   PHURINE 6.0 02/12/2022 1730   GLUCOSEU NEGATIVE 02/12/2022 1730  HGBUR LARGE (A) 02/12/2022 1730   BILIRUBINUR NEGATIVE 02/12/2022 1730   KETONESUR NEGATIVE 02/12/2022 1730   PROTEINUR 100 (A) 02/12/2022 1730   NITRITE POSITIVE (A) 02/12/2022 1730   LEUKOCYTESUR LARGE (A) 02/12/2022 1730   Sepsis Labs Recent Labs  Lab 02/12/22 1745 02/14/22 0345 02/16/22 1353 02/17/22 0749  WBC 15.1* 9.0 8.0 8.5   Microbiology Recent Results (from the past 240 hour(s))  Urine Culture     Status: Abnormal   Collection Time: 02/12/22  5:30 PM   Specimen: In/Out Cath Urine  Result Value Ref Range Status   Specimen Description   Final    IN/OUT CATH URINE Performed at [facility], [address] [place], [place] [address]    Special Requests   Final    NONE Performed at [facility], [address] [place], [place] [address]    Culture MULTIPLE SPECIES PRESENT, SUGGEST RECOLLECTION (A)  Final   Report Status 02/14/2022 FINAL  Final  Blood Culture (routine x 2)     Status: Abnormal   Collection Time: 02/12/22  5:58 PM   Specimen: BLOOD  Result Value Ref Range Status   Specimen Description   Final    BLOOD RIGHT ANTECUBITAL Performed at [facility], [place] [address], [place], [place] [address]    Special Requests   Final    Blood Culture adequate volume BOTTLES DRAWN AEROBIC AND ANAEROBIC Performed at [facility], [address] [place], [place] [address]    Culture  Setup Time   Final    GRAM POSITIVE COCCI IN CLUSTERS IN BOTH AEROBIC AND ANAEROBIC BOTTLES CRITICAL RESULT CALLED TO, READ BACK BY AND VERIFIED WITH: RN S.JOUGE AT 0962 ON 02/13/2022 BY T.SAAD. Performed at [facility], [place] [address] [place], [place] [address]    Culture METHICILLIN RESISTANT STAPHYLOCOCCUS AUREUS (A)  Final   Report Status 02/15/2022 FINAL  Final   Organism ID, Bacteria METHICILLIN RESISTANT STAPHYLOCOCCUS AUREUS  Final       Susceptibility   Methicillin resistant staphylococcus aureus - MIC*    CIPROFLOXACIN >=8 RESISTANT Resistant     ERYTHROMYCIN >=8 RESISTANT Resistant     GENTAMICIN <=0.5 SENSITIVE Sensitive     OXACILLIN >=4 RESISTANT Resistant     TETRACYCLINE <=1 SENSITIVE Sensitive     VANCOMYCIN 1 SENSITIVE Sensitive     TRIMETH/SULFA <=10 SENSITIVE Sensitive     CLINDAMYCIN <=0.25 SENSITIVE Sensitive     RIFAMPIN <=0.5 SENSITIVE Sensitive     Inducible Clindamycin NEGATIVE Sensitive     * METHICILLIN RESISTANT STAPHYLOCOCCUS AUREUS  Blood Culture ID Panel (Reflexed)     Status: Abnormal   Collection Time: 02/12/22  5:58 PM  Result Value Ref Range Status   Enterococcus faecalis NOT DETECTED NOT DETECTED Final   Enterococcus Faecium NOT DETECTED NOT DETECTED Final   Listeria monocytogenes NOT DETECTED NOT DETECTED Final   Staphylococcus species DETECTED (A) NOT DETECTED Final    Comment: CRITICAL RESULT CALLED TO, READ BACK BY AND VERIFIED WITH: RN S.JOUGE AT 9476 ON 02/13/2022 BY T.SAAD.    Staphylococcus aureus (BCID) DETECTED (A) NOT DETECTED Final    Comment: Methicillin (oxacillin)-resistant Staphylococcus aureus (MRSA). MRSA is predictably resistant to beta-lactam antibiotics (except ceftaroline). Preferred therapy is vancomycin unless clinically contraindicated. Patient requires contact precautions if  hospitalized. CRITICAL RESULT CALLED TO, READ BACK BY AND VERIFIED WITH: RN S.JOUGE AT 5465 ON 02/13/2022 BY T.SAAD.    Staphylococcus epidermidis  NOT DETECTED NOT DETECTED Final   Staphylococcus lugdunensis NOT DETECTED NOT DETECTED Final   Streptococcus species NOT DETECTED NOT DETECTED Final   Streptococcus agalactiae NOT DETECTED NOT DETECTED Final   Streptococcus pneumoniae NOT DETECTED NOT DETECTED Final   Streptococcus pyogenes NOT DETECTED NOT DETECTED Final   A.calcoaceticus-baumannii NOT DETECTED NOT DETECTED Final   Bacteroides fragilis NOT DETECTED NOT DETECTED Final    Enterobacterales NOT DETECTED NOT DETECTED Final   Enterobacter cloacae complex NOT DETECTED NOT DETECTED Final   Escherichia coli NOT DETECTED NOT DETECTED Final   Klebsiella aerogenes NOT DETECTED NOT DETECTED Final   Klebsiella oxytoca NOT DETECTED NOT DETECTED Final   Klebsiella pneumoniae NOT DETECTED NOT DETECTED Final   Proteus species NOT DETECTED NOT DETECTED Final   Salmonella species NOT DETECTED NOT DETECTED Final   Serratia marcescens NOT DETECTED NOT DETECTED Final   Haemophilus influenzae NOT DETECTED NOT DETECTED Final   Neisseria meningitidis NOT DETECTED NOT DETECTED Final   Pseudomonas aeruginosa NOT DETECTED NOT DETECTED Final   Stenotrophomonas maltophilia NOT DETECTED NOT DETECTED Final   Candida albicans NOT DETECTED NOT DETECTED Final   Candida auris NOT DETECTED NOT DETECTED Final   Candida glabrata NOT DETECTED NOT DETECTED Final   Candida krusei NOT DETECTED NOT DETECTED Final   Candida parapsilosis NOT DETECTED NOT DETECTED Final   Candida tropicalis NOT DETECTED NOT DETECTED Final   Cryptococcus neoformans/gattii NOT DETECTED NOT DETECTED Final   Meth resistant mecA/C and MREJ DETECTED (A) NOT DETECTED Final    Comment: CRITICAL RESULT CALLED TO, READ BACK BY AND VERIFIED WITH: RN S.JOUGE AT 9381 ON 02/13/2022 BY T.SAAD. Performed at [facility], [place] [address] [place], [place] [address]   Blood Culture (routine x 2)     Status: Abnormal   Collection Time: 02/12/22  6:10 PM   Specimen: BLOOD  Result Value Ref Range Status   Specimen Description   Final    BLOOD LEFT ANTECUBITAL Performed at [facility], [place] [address], [place], [place] [address]    Special Requests   Final    Blood Culture results may not be optimal due to an inadequate volume of blood received in culture bottles [place] Performed at [facility], [address] [place], [place] [address]    Culture  Setup Time   Final    GRAM  POSITIVE COCCI IN CLUSTERS AEROBIC BOTTLE ONLY CRITICAL VALUE NOTED.  VALUE IS CONSISTENT WITH PREVIOUSLY REPORTED AND CALLED VALUE.    Culture (A)  Final    STAPHYLOCOCCUS CAPITIS STAPHYLOCOCCUS EPIDERMIDIS THE SIGNIFICANCE OF ISOLATING THIS ORGANISM FROM A SINGLE SET OF BLOOD CULTURES WHEN MULTIPLE SETS ARE DRAWN IS UNCERTAIN. PLEASE NOTIFY THE MICROBIOLOGY DEPARTMENT WITHIN ONE WEEK IF SPECIATION AND SENSITIVITIES ARE REQUIRED. CRITICAL RESULT CALLED TO, READ BACK BY AND VERIFIED WITH: PHARMD NICK  G. [phone] FCP Performed at [facility], [place] [address], [place], [place] [address]    Report Status 02/15/2022 FINAL  Final  Resp Panel by RT-PCR (Flu A&B, Covid)     Status: None   Collection Time: 02/12/22  6:19 PM   Specimen: Nasal Swab  Result Value Ref Range Status   SARS Coronavirus 2 by RT PCR NEGATIVE NEGATIVE Final    Comment: (NOTE) SARS-CoV-2 target nucleic acids are NOT DETECTED.  The SARS-CoV-2 RNA is generally detectable in upper respiratory specimens during the acute phase of infection. The lowest concentration of SARS-CoV-2  viral copies this assay can detect is 138 copies/mL. A negative result does not preclude SARS-Cov-2 infection and should not be used as the sole basis for treatment or other patient management decisions. A negative result may occur with  improper specimen collection/handling, submission of specimen other than nasopharyngeal swab, presence of viral mutation(s) within the areas targeted by this assay, and inadequate number of viral copies(<138 copies/mL). A negative result must be combined with clinical observations, patient history, and epidemiological information. The expected result is Negative.  Fact Sheet for Patients:  EntrepreneurPulse.com.au  Fact Sheet for Healthcare Providers:  IncredibleEmployment.be  This test is no t yet approved or cleared by the [place] FDA and  has been  authorized for detection and/or diagnosis of SARS-CoV-2 by FDA under an Emergency Use Authorization (EUA). This EUA will remain  in effect (meaning this test can be used) for the duration of the COVID-19 declaration under Section 564(b)(1) of the Act, 21 U.S.C.section 360bbb-3(b)(1), unless the authorization is terminated  or revoked sooner.       Influenza A by PCR NEGATIVE NEGATIVE Final   Influenza B by PCR NEGATIVE NEGATIVE Final    Comment: (NOTE) The Xpert Xpress SARS-CoV-2/FLU/RSV plus assay is intended as an aid in the diagnosis of influenza from Nasopharyngeal swab specimens and should not be used as a sole basis for treatment. Nasal washings and aspirates are unacceptable for Xpert Xpress SARS-CoV-2/FLU/RSV testing.  Fact Sheet for Patients: EntrepreneurPulse.com.au  Fact Sheet for Healthcare Providers: IncredibleEmployment.be  This test is not yet approved or cleared by the [place] FDA and has been authorized for detection and/or diagnosis of SARS-CoV-2 by FDA under an Emergency Use Authorization (EUA). This EUA will remain in effect (meaning this test can be used) for the duration of the COVID-19 declaration under Section 564(b)(1) of the Act, 21 U.S.C. section 360bbb-3(b)(1), unless the authorization is terminated or revoked.  Performed at [facility], [address] [place], [place] [address]   Culture, blood (Routine X 2) w Reflex to ID Panel     Status: None (Preliminary result)   Collection Time: 02/14/22  4:23 PM   Specimen: BLOOD  Result Value Ref Range Status   Specimen Description   Final    BLOOD BLOOD LEFT HAND Performed at [facility] [address], [place], [place] [address]    Special Requests   Final    BOTTLES DRAWN AEROBIC ONLY Blood Culture adequate volume Performed at [facility] [address], [place], [place] [address]    Culture   Final     NO GROWTH 3 DAYS Performed at [facility], [place] [address], [place], [place] [address]    Report Status PENDING  Incomplete  Culture, blood (Routine X 2) w Reflex to ID Panel     Status: None (Preliminary result)   Collection Time: 02/14/22  4:23 PM   Specimen: BLOOD  Result Value Ref Range Status   Specimen Description   Final    BLOOD BLOOD RIGHT HAND Performed at [facility] [address], [place], [place] [address]    Special Requests   Final    BOTTLES DRAWN AEROBIC ONLY Blood Culture adequate volume Performed at [facility] [address] [place], [place] [address]    Culture   Final    NO GROWTH 3 DAYS Performed at [facility], [place] [address], [place], [place] [address]    Report  Status PENDING  Incomplete  MRSA Next Gen by PCR, Nasal     Status: Abnormal   Collection Time: 02/16/22  8:50 AM   Specimen: Nasal Mucosa; Nasal Swab  Result Value Ref Range Status   MRSA by PCR Next Gen DETECTED (A) NOT DETECTED Final    Comment: (NOTE) The GeneXpert MRSA Assay (FDA approved for NASAL specimens only), is one component of a comprehensive MRSA colonization surveillance program. It is not intended to diagnose MRSA infection nor to guide or monitor treatment for MRSA infections. Test performance is not FDA approved in patients less than 30 years old. Performed at [facility], [place] [address] [place], [place] [address]      Time coordinating discharge: Over 30 minutes  SIGNED:   Darliss Cheney, MD  [facility] 02/17/2022, 10:35 AM *Please note that this is a verbal dictation therefore any spelling or grammatical errors are due to the "[employer]" system interpretation. If 7PM-7AM, please contact night-coverage [email or web address]

## 2022-02-19 LAB — CULTURE, BLOOD (ROUTINE X 2)
Culture: NO GROWTH
Culture: NO GROWTH
Special Requests: ADEQUATE
Special Requests: ADEQUATE

## 2022-02-27 ENCOUNTER — Encounter: Payer: Self-pay | Admitting: Infectious Disease

## 2022-03-02 NOTE — Progress Notes (Signed)
Unrelated to each other

## 2022-03-10 ENCOUNTER — Telehealth: Payer: Self-pay

## 2022-03-10 NOTE — Telephone Encounter (Signed)
Received fax from [facility] that home health services were denied.   Per TOC note, patient's home health service is [facility], not [facility].   Confirmed with [facility] that patient is under their care and receiving IV antibiotics.   Sandie Ano, RN

## 2022-03-22 ENCOUNTER — Encounter: Payer: Self-pay | Admitting: Infectious Disease

## 2022-03-22 ENCOUNTER — Other Ambulatory Visit: Payer: Self-pay

## 2022-03-22 ENCOUNTER — Ambulatory Visit (INDEPENDENT_AMBULATORY_CARE_PROVIDER_SITE_OTHER): Payer: Medicare (Managed Care) | Admitting: Infectious Disease

## 2022-03-22 VITALS — BP 95/65 | HR 107 | Resp 16 | Ht 60.25 in | Wt 137.0 lb

## 2022-03-22 DIAGNOSIS — R7881 Bacteremia: Secondary | ICD-10-CM

## 2022-03-22 DIAGNOSIS — L89153 Pressure ulcer of sacral region, stage 3: Secondary | ICD-10-CM

## 2022-03-22 DIAGNOSIS — Q052 Lumbar spina bifida with hydrocephalus: Secondary | ICD-10-CM

## 2022-03-22 DIAGNOSIS — B9562 Methicillin resistant Staphylococcus aureus infection as the cause of diseases classified elsewhere: Secondary | ICD-10-CM

## 2022-03-22 DIAGNOSIS — E1165 Type 2 diabetes mellitus with hyperglycemia: Secondary | ICD-10-CM | POA: Diagnosis not present

## 2022-03-22 NOTE — Progress Notes (Signed)
Subjective:  Chief complaint follow-up for MRSA bacteremia  Patient ID: April Cabrera, female    DOB: Jan 09, 1971, 51 y.o.   MRN: [id number]  HPI   April Cabrera is a 51 y.o. female with spina bifida, decubitus ulcers, sp multiple I&D in January 2023 with OR Cx+ PsA and corynebacterium and split thickness graft on 08/25/21(Followed by [facility] ID) treated with daptomycin +cefepime x 6 weeks EOT 10/02/21 now admitted with MRSA bacteremia.  Source of bacteremia is not clear.  She did have an area that looked slightly cellulitic in her lower extremity but was not overwhelming.  Had multiple decubitus ulcers but they had all been improving.  We did not pursue transesophageal echocardiogram but did 2D echocardiogram that showed no evidence of endocarditis.  He is planned to complete 6 weeks of IV daptomycin on 27 March 2022.  Have some home health of been reviewed and show her to have a slight leukocytosis with a white count 11.3 otherwise unremarkable CBC CMP with normal labs. I do not see at CPK level though.  She has been followed by Dr. Zigmund Daniel at [facility] re her his ulcers and in his most recent note from 03 March 2022 commented:  Sacral wound: Large area upper sacrum with 5 6 small wounds. Largest is over the left ischium was about 2 cm deep the other ones of different sizes. They appear improved to previous picture with new epithelium reasonable granular tissue. No signs of infection      Past Medical History:  Diagnosis Date   DMII (diabetes mellitus, type 2) ([place])    Paraplegia ([place])    Spina bifida ([place])     No past surgical history on file.  No family history on file.    Social History   Socioeconomic History   Marital status: Single    Spouse name: Not on file   Number of children: Not on file   Years of education: Not on file   Highest education level: Not on file  Occupational History   Not on file  Tobacco Use   Smoking status: Never   Smokeless tobacco: Never   Substance and Sexual Activity   Alcohol use: Never   Drug use: Never   Sexual activity: Not on file  Other Topics Concern   Not on file  Social History Narrative   Not on file   Social Determinants of Health   Financial Resource Strain: Not on file  Food Insecurity: Not on file  Transportation Needs: Not on file  Physical Activity: Not on file  Stress: Not on file  Social Connections: Not on file    Allergies  Allergen Reactions   Ciprofloxacin Anaphylaxis   Nitrofurantoin Swelling and Other (See Comments)    Face became swollen and the throat started to swell (Benadryl needed)   Tape Swelling and Other (See Comments)    Facial swelling/redness-pt denies swelling but does state all tape causes redness    Wound Dressing Adhesive Swelling and Other (See Comments)    Facial swelling and redness   Bacitracin Other (See Comments)    Redness    Losartan Other (See Comments)    GI upset    Latex Rash and Other (See Comments)    "Precautions" (?)   Neosporin Original [Neomycin-Bacitracin Zn-Polymyx] Rash and Other (See Comments)    Redness, also     Current Outpatient Medications:    aspirin 325 MG tablet, Take 325 mg by mouth in the morning., Disp: , Rfl:  CALMOSEPTINE 0.44-20.6 % OINT, Apply 1 application  topically See admin instructions. Apply to affected areas of the skin once a day, Disp: , Rfl:    cetirizine (ZYRTEC) 10 MG tablet, Take 10 mg by mouth daily., Disp: , Rfl:    daptomycin (CUBICIN) IVPB, Inject 500 mg into the vein daily. Indication:  MRSA bacteremia First Dose: No Last Day of Therapy:  03/27/2022 Labs - Once weekly:  CBC/D, BMP, and CPK Labs - Every other week:  ESR and CRP Method of administration: IV Push Method of administration may be changed at the discretion of home infusion pharmacist based upon assessment of the patient and/or caregiver's ability to self-administer the medication ordered., Disp: 28 Units, Rfl: 0   docusate sodium (COLACE) 100 MG  capsule, Take 100 mg by mouth 2 (two) times daily., Disp: , Rfl:    FERREX 150 150 MG capsule, Take 150 mg by mouth 2 (two) times daily with a meal., Disp: , Rfl:    glipiZIDE (GLUCOTROL XL) 2.5 MG 24 hr tablet, Take 2.5 mg by mouth daily with breakfast., Disp: , Rfl:    JANUVIA 100 MG tablet, Take 100 mg by mouth in the morning., Disp: , Rfl:    metFORMIN (GLUCOPHAGE) 500 MG tablet, Take 1,000 mg by mouth 2 (two) times daily with a meal., Disp: , Rfl:    MYRBETRIQ 50 MG TB24 tablet, Take 50 mg by mouth daily., Disp: , Rfl:    nystatin ointment (MYCOSTATIN), Apply 1 Application topically See admin instructions. Apply to sacral wound once a day, as part of wound care and an additional application to affected areas of the bends of the arms, right upper leg, and under the breasts once a day., Disp: , Rfl:    Skin Protectants, Misc. (REMEDY PHYTOPLEX HYDRAGUARD) CREA, Apply 1 application  topically See admin instructions. Apply to affected areas of the skin once a day, Disp: , Rfl:    trospium (SANCTURA) 20 MG tablet, Take 20 mg by mouth 2 (two) times daily., Disp: , Rfl:    zinc sulfate 220 (50 Zn) MG capsule, Take 220 mg by mouth in the morning., Disp: , Rfl:     Review of Systems  Constitutional:  Negative for activity change, appetite change, chills, diaphoresis, fatigue, fever and unexpected weight change.  HENT:  Negative for congestion, rhinorrhea, sinus pressure, sneezing, sore throat and trouble swallowing.   Eyes:  Negative for photophobia and visual disturbance.  Respiratory:  Negative for cough, chest tightness, shortness of breath, wheezing and stridor.   Cardiovascular:  Negative for chest pain, palpitations and leg swelling.  Gastrointestinal:  Negative for abdominal distention, abdominal pain, anal bleeding, blood in stool, constipation, diarrhea, nausea and vomiting.  Genitourinary:  Negative for difficulty urinating, dysuria, flank pain and hematuria.  Musculoskeletal:  Negative  for arthralgias, back pain, gait problem, joint swelling and myalgias.  Skin:  Positive for wound. Negative for color change, pallor and rash.  Neurological:  Positive for weakness. Negative for dizziness, tremors and light-headedness.  Hematological:  Negative for adenopathy. Does not bruise/bleed easily.  Psychiatric/Behavioral:  Negative for agitation, behavioral problems, confusion, decreased concentration, dysphoric mood and sleep disturbance.        Objective:   Physical Exam HENT:     Head: Normocephalic and atraumatic.     Nose: Nose normal.  Cardiovascular:     Rate and Rhythm: Normal rate and regular rhythm.  Pulmonary:     Effort: Pulmonary effort is normal. No respiratory distress.  Breath sounds: No wheezing.  Abdominal:     General: There is no distension.  Skin:    Coloration: Skin is pale.  Neurological:     Mental Status: She is alert and oriented to person, place, and time.  Psychiatric:        Attention and Perception: Attention normal.        Mood and Affect: Mood normal.        Speech: Speech is delayed.        Cognition and Memory: Memory is impaired. She exhibits impaired recent memory.     Legs are not cellulitic  PICC 03/22/2022:         Assessment & Plan:   MRSA dreamy and not clear-cut source.  There was some thought of the area of cellulitis could have been the culprit but it was not really a purulent cellulitis decubitus ulcers were also felt to be doing well when she was in the hospital  We never exclude endocarditis over completing a 6-week course of daptomycin.  When this is completed we will have the PICC line pulled.  I will then check some surveillance cultures 2+  weeks after that.  Decubitus ulcers: He is being followed closely by wound care will look at them ourselves at next visit as well.   I spent 41  minutes with the patient including than 50% of the time in face to face counseling of the patient and her MRSA bacteremia  decubitus ulcers along with review of medical records in preparation for the visit and during the visit and in coordination of her care.

## 2022-03-29 ENCOUNTER — Telehealth: Payer: Self-pay

## 2022-03-29 NOTE — Telephone Encounter (Signed)
Amber from [facility] called and stated that she was at patients home to pull picc line today but she is hesitant to pull it because the patients urine is purple.

## 2022-04-13 ENCOUNTER — Ambulatory Visit (INDEPENDENT_AMBULATORY_CARE_PROVIDER_SITE_OTHER): Payer: Medicare (Managed Care) | Admitting: Infectious Disease

## 2022-04-13 ENCOUNTER — Encounter: Payer: Self-pay | Admitting: Infectious Disease

## 2022-04-13 ENCOUNTER — Other Ambulatory Visit: Payer: Self-pay

## 2022-04-13 VITALS — BP 96/64 | HR 106 | Temp 98.0°F

## 2022-04-13 DIAGNOSIS — L89153 Pressure ulcer of sacral region, stage 3: Secondary | ICD-10-CM | POA: Diagnosis not present

## 2022-04-13 DIAGNOSIS — L03116 Cellulitis of left lower limb: Secondary | ICD-10-CM

## 2022-04-13 DIAGNOSIS — R7881 Bacteremia: Secondary | ICD-10-CM | POA: Diagnosis not present

## 2022-04-13 DIAGNOSIS — Q051 Thoracic spina bifida with hydrocephalus: Secondary | ICD-10-CM

## 2022-04-13 DIAGNOSIS — B9562 Methicillin resistant Staphylococcus aureus infection as the cause of diseases classified elsewhere: Secondary | ICD-10-CM

## 2022-04-13 NOTE — Progress Notes (Signed)
Subjective:  Chief complaint: Follow-up for MRSA bacteremia  Patient ID: April Cabrera, female    DOB: 04-02-1971, 51 y.o.   MRN: [id number]  HPI   April Cabrera is a 51 y.o. female with spina bifida, decubitus ulcers, sp multiple I&D in January 2023 with OR Cx+ PsA and corynebacterium and split thickness graft on 08/25/21(Followed by [facility] ID) treated with daptomycin +cefepime x 6 weeks EOT 10/02/21 now admitted with MRSA bacteremia.  Source of bacteremia is not clear.  She did have an area that looked slightly cellulitic in her lower extremity but was not overwhelming.  Had multiple decubitus ulcers but they had all been improving.  We did not pursue transesophageal echocardiogram but did 2D echocardiogram that showed no evidence of endocarditis.  He is planned to complete 6 weeks of IV daptomycin on 27 March 2022.  Have some home health of been reviewed and show her to have a slight leukocytosis with a white count 11.3 otherwise unremarkable CBC CMP with normal labs. I do not see at CPK level though.  She has been followed by Dr. Ronda Fairly at [facility] re her his ulcers and in his most recent note from 03 March 2022 commented:  Sacral wound: Large area upper sacrum with 5 6 small wounds. Largest is over the left ischium was about 2 cm deep the other ones of different sizes. They appear improved to previous picture with new epithelium reasonable granular tissue. No signs of infection    We had scheduled her to come back for surveillance blood cultures but she was given a prescription for Bactrim and she took a dose this morning this was as a prophylactic medication in anticipation of Botox injection into her bladder.    Past Medical History:  Diagnosis Date   DMII (diabetes mellitus, type 2) ([facility])    Paraplegia ([facility])    Spina bifida ([facility])     No past surgical history on file.  No family history on file.    Social History   Socioeconomic History   Marital status: Single     Spouse name: Not on file   Number of children: Not on file   Years of education: Not on file   Highest education level: Not on file  Occupational History   Not on file  Tobacco Use   Smoking status: Never   Smokeless tobacco: Never  Substance and Sexual Activity   Alcohol use: Never   Drug use: Never   Sexual activity: Not on file  Other Topics Concern   Not on file  Social History Narrative   Not on file   Social Determinants of Health   Financial Resource Strain: Not on file  Food Insecurity: Not on file  Transportation Needs: Not on file  Physical Activity: Not on file  Stress: Not on file  Social Connections: Not on file    Allergies  Allergen Reactions   Ciprofloxacin Anaphylaxis   Nitrofurantoin Swelling and Other (See Comments)    Face became swollen and the throat started to swell (Benadryl needed)   Tape Swelling and Other (See Comments)    Facial swelling/redness-pt denies swelling but does state all tape causes redness    Wound Dressing Adhesive Swelling and Other (See Comments)    Facial swelling and redness   Bacitracin Other (See Comments)    Redness    Losartan Other (See Comments)    GI upset    Latex Rash and Other (See Comments)    "Precautions" (?)  Neosporin Original [Neomycin-Bacitracin Zn-Polymyx] Rash and Other (See Comments)    Redness, also     Current Outpatient Medications:    aspirin 325 MG tablet, Take 325 mg by mouth in the morning., Disp: , Rfl:    CALMOSEPTINE 0.44-20.6 % OINT, Apply 1 application  topically See admin instructions. Apply to affected areas of the skin once a day, Disp: , Rfl:    cetirizine (ZYRTEC) 10 MG tablet, Take 10 mg by mouth daily., Disp: , Rfl:    sulfamethoxazole-trimethoprim (BACTRIM DS) 800-160 MG tablet, Take 1 tablet by mouth 2 (two) times daily., Disp: , Rfl:    docusate sodium (COLACE) 100 MG capsule, Take 100 mg by mouth 2 (two) times daily. (Patient not taking: Reported on 04/13/2022), Disp: , Rfl:     FERREX 150 150 MG capsule, Take 150 mg by mouth 2 (two) times daily with a meal. (Patient not taking: Reported on 04/13/2022), Disp: , Rfl:    glipiZIDE (GLUCOTROL XL) 2.5 MG 24 hr tablet, Take 2.5 mg by mouth daily with breakfast. (Patient not taking: Reported on 04/13/2022), Disp: , Rfl:    JANUVIA 100 MG tablet, Take 100 mg by mouth in the morning. (Patient not taking: Reported on 04/13/2022), Disp: , Rfl:    lisinopril (ZESTRIL) 10 MG tablet, Take 10 mg by mouth daily. (Patient not taking: Reported on 04/13/2022), Disp: , Rfl:    metFORMIN (GLUCOPHAGE) 500 MG tablet, Take 1,000 mg by mouth 2 (two) times daily with a meal. (Patient not taking: Reported on 04/13/2022), Disp: , Rfl:    MYRBETRIQ 50 MG TB24 tablet, Take 50 mg by mouth daily. (Patient not taking: Reported on 04/13/2022), Disp: , Rfl:    nystatin ointment (MYCOSTATIN), Apply 1 Application topically See admin instructions. Apply to sacral wound once a day, as part of wound care and an additional application to affected areas of the bends of the arms, right upper leg, and under the breasts once a day. (Patient not taking: Reported on 04/13/2022), Disp: , Rfl:    Skin Protectants, Misc. (REMEDY PHYTOPLEX HYDRAGUARD) CREA, Apply 1 application  topically See admin instructions. Apply to affected areas of the skin once a day (Patient not taking: Reported on 04/13/2022), Disp: , Rfl:    trospium (SANCTURA) 20 MG tablet, Take 20 mg by mouth 2 (two) times daily. (Patient not taking: Reported on 04/13/2022), Disp: , Rfl:    zinc sulfate 220 (50 Zn) MG capsule, Take 220 mg by mouth in the morning. (Patient not taking: Reported on 04/13/2022), Disp: , Rfl:     Review of Systems  Constitutional:  Negative for activity change, appetite change, chills, diaphoresis, fatigue, fever and unexpected weight change.  HENT:  Negative for congestion, rhinorrhea, sinus pressure, sneezing, sore throat and trouble swallowing.   Eyes:  Negative for photophobia and  visual disturbance.  Respiratory:  Negative for cough, chest tightness, shortness of breath, wheezing and stridor.   Cardiovascular:  Negative for chest pain, palpitations and leg swelling.  Gastrointestinal:  Negative for abdominal distention, abdominal pain, anal bleeding, blood in stool, constipation, diarrhea, nausea and vomiting.  Genitourinary:  Negative for difficulty urinating, dysuria, flank pain and hematuria.  Musculoskeletal:  Negative for arthralgias, back pain, gait problem, joint swelling and myalgias.  Skin:  Negative for color change, pallor, rash and wound.  Neurological:  Negative for dizziness, tremors, weakness and light-headedness.  Hematological:  Negative for adenopathy. Does not bruise/bleed easily.  Psychiatric/Behavioral:  Negative for agitation, behavioral problems, confusion, decreased concentration, dysphoric  mood and sleep disturbance.        Objective:   Physical Exam Constitutional:      General: She is not in acute distress.    Appearance: She is not diaphoretic.  HENT:     Head: Normocephalic and atraumatic.     Right Ear: External ear normal.     Left Ear: External ear normal.     Nose: Nose normal.     Mouth/Throat:     Pharynx: No oropharyngeal exudate.  Eyes:     General: No scleral icterus.       Right eye: No discharge.        Left eye: No discharge.     Extraocular Movements: Extraocular movements intact.     Conjunctiva/sclera: Conjunctivae normal.  Cardiovascular:     Rate and Rhythm: Normal rate and regular rhythm.     Heart sounds:     No friction rub.  Pulmonary:     Effort: Pulmonary effort is normal. No respiratory distress.     Breath sounds: No wheezing or rales.  Abdominal:     General: There is no distension.     Palpations: Abdomen is soft.     Tenderness: There is no rebound.  Musculoskeletal:        General: No tenderness. Normal range of motion.     Cervical back: Normal range of motion and neck supple.   Lymphadenopathy:     Cervical: No cervical adenopathy.  Skin:    General: Skin is warm and dry.     Coloration: Skin is not jaundiced or pale.     Findings: No erythema, lesion or rash.  Neurological:     Mental Status: She is alert and oriented to person, place, and time.     Coordination: Coordination normal.  Psychiatric:        Attention and Perception: Attention normal.        Behavior: Behavior normal.        Judgment: Judgment normal.    No cellultis  Sacral wounds are healing       Assessment & Plan:   MRSA bacteremia without a clear-cut source.  Reportedly her decubitus ulcers are healing up.  We had intended on doing surveillance blood cultures today but with a dose of Bactrim that compromises that.  I have asked she and her mother to reschedule an appointment for 2 weeks after they finish all antibiotics of any "signed and I can do so via lab appointment they do not need a physician appointment for this   Decubitus ulcers: Appears to be doing well per history

## 2022-05-14 ENCOUNTER — Other Ambulatory Visit: Payer: Medicare (Managed Care)

## 2022-05-14 ENCOUNTER — Other Ambulatory Visit: Payer: Self-pay

## 2022-05-14 DIAGNOSIS — B9562 Methicillin resistant Staphylococcus aureus infection as the cause of diseases classified elsewhere: Secondary | ICD-10-CM

## 2022-05-14 NOTE — Addendum Note (Signed)
Addended by: Lucie Leather D on: 05/14/2022 08:32 AM   Modules accepted: Orders

## 2022-05-20 LAB — CULTURE, BLOOD (SINGLE)
MICRO NUMBER:: 14049751
MICRO NUMBER:: 14049752
Result:: NO GROWTH
Result:: NO GROWTH
SPECIMEN QUALITY:: ADEQUATE
SPECIMEN QUALITY:: ADEQUATE
